# Patient Record
Sex: Female | Born: 1960 | Race: White | Hispanic: No | Marital: Married | Smoking: Current every day smoker
Health system: Southern US, Community
[De-identification: ages and names within clinical notes are randomized; demographics above are authoritative.]

---

## 2007-10-09 ENCOUNTER — Emergency Department: Payer: Self-pay | Admitting: Emergency Medicine

## 2008-12-17 ENCOUNTER — Emergency Department: Payer: Self-pay | Admitting: Internal Medicine

## 2009-06-30 ENCOUNTER — Emergency Department: Payer: Self-pay | Admitting: Emergency Medicine

## 2009-07-17 ENCOUNTER — Emergency Department: Payer: Self-pay | Admitting: Emergency Medicine

## 2010-02-26 ENCOUNTER — Emergency Department: Payer: Self-pay | Admitting: Emergency Medicine

## 2011-12-24 ENCOUNTER — Emergency Department: Payer: Self-pay | Admitting: Emergency Medicine

## 2011-12-24 LAB — CBC WITH DIFFERENTIAL/PLATELET
Basophil #: 0.1 10*3/uL (ref 0.0–0.1)
Basophil %: 1.2 %
Eosinophil %: 2.9 %
Lymphocyte %: 16.9 %
MCH: 29.1 pg (ref 26.0–34.0)
Monocyte %: 18.2 %
Neutrophil %: 60.8 %
Platelet: 155 10*3/uL (ref 150–440)
RBC: 4.39 10*6/uL (ref 3.80–5.20)
WBC: 5.6 10*3/uL (ref 3.6–11.0)

## 2011-12-24 LAB — COMPREHENSIVE METABOLIC PANEL
Albumin: 3.6 g/dL (ref 3.4–5.0)
Anion Gap: 11 (ref 7–16)
Calcium, Total: 9 mg/dL (ref 8.5–10.1)
Chloride: 106 mmol/L (ref 98–107)
Creatinine: 0.83 mg/dL (ref 0.60–1.30)
Glucose: 105 mg/dL — ABNORMAL HIGH (ref 65–99)
Potassium: 3.5 mmol/L (ref 3.5–5.1)
SGOT(AST): 19 U/L (ref 15–37)
SGPT (ALT): 17 U/L (ref 12–78)
Total Protein: 7.4 g/dL (ref 6.4–8.2)

## 2012-08-25 ENCOUNTER — Emergency Department: Payer: Self-pay | Admitting: Emergency Medicine

## 2012-08-25 LAB — CBC
HCT: 38.8 % (ref 35.0–47.0)
HGB: 13.3 g/dL (ref 12.0–16.0)
MCHC: 34.2 g/dL (ref 32.0–36.0)
RDW: 13.5 % (ref 11.5–14.5)

## 2012-08-25 LAB — TSH: Thyroid Stimulating Horm: 16.2 u[IU]/mL — ABNORMAL HIGH

## 2012-08-25 LAB — BASIC METABOLIC PANEL
Anion Gap: 6 — ABNORMAL LOW (ref 7–16)
BUN: 11 mg/dL (ref 7–18)
Calcium, Total: 9.2 mg/dL (ref 8.5–10.1)
Chloride: 109 mmol/L — ABNORMAL HIGH (ref 98–107)
EGFR (African American): >60
EGFR (Non-African Amer.): >60
Glucose: 98 mg/dL (ref 65–99)
Osmolality: 279 (ref 275–301)
Potassium: 3.6 mmol/L (ref 3.5–5.1)

## 2012-08-25 LAB — TROPONIN I: Troponin-I: <0.02 ng/mL

## 2012-12-27 ENCOUNTER — Emergency Department: Payer: Self-pay

## 2012-12-27 LAB — COMPREHENSIVE METABOLIC PANEL
Albumin: 3.9 g/dL (ref 3.4–5.0)
BUN: 12 mg/dL (ref 7–18)
Bilirubin,Total: 0.5 mg/dL (ref 0.2–1.0)
Calcium, Total: 9.3 mg/dL (ref 8.5–10.1)
Chloride: 109 mmol/L — ABNORMAL HIGH (ref 98–107)
Co2: 22 mmol/L (ref 21–32)
EGFR (African American): >60
Osmolality: 274 (ref 275–301)
Potassium: 3.5 mmol/L (ref 3.5–5.1)
SGOT(AST): 24 U/L (ref 15–37)
SGPT (ALT): 16 U/L (ref 12–78)
Total Protein: 7.5 g/dL (ref 6.4–8.2)

## 2012-12-27 LAB — CBC
HGB: 13.3 g/dL (ref 12.0–16.0)
MCH: 29.2 pg (ref 26.0–34.0)
MCV: 84 fL (ref 80–100)
Platelet: 193 10*3/uL (ref 150–440)
RBC: 4.54 10*6/uL (ref 3.80–5.20)
RDW: 13.9 % (ref 11.5–14.5)
WBC: 6.6 10*3/uL (ref 3.6–11.0)

## 2012-12-27 LAB — CK TOTAL AND CKMB (NOT AT ARMC): CK, Total: 59 U/L (ref 21–215)

## 2013-01-04 ENCOUNTER — Emergency Department: Payer: Self-pay | Admitting: Emergency Medicine

## 2013-01-04 LAB — COMPREHENSIVE METABOLIC PANEL
Albumin: 3.9 g/dL (ref 3.4–5.0)
Alkaline Phosphatase: 77 U/L (ref 50–136)
Anion Gap: 7 (ref 7–16)
BUN: 6 mg/dL — ABNORMAL LOW (ref 7–18)
Bilirubin,Total: 0.6 mg/dL (ref 0.2–1.0)
Calcium, Total: 9.2 mg/dL (ref 8.5–10.1)
Chloride: 108 mmol/L — ABNORMAL HIGH (ref 98–107)
EGFR (African American): >60
Glucose: 91 mg/dL (ref 65–99)
Osmolality: 271 (ref 275–301)
SGOT(AST): 35 U/L (ref 15–37)
SGPT (ALT): 17 U/L (ref 12–78)
Sodium: 137 mmol/L (ref 136–145)

## 2013-01-04 LAB — CBC
HCT: 37.8 % (ref 35.0–47.0)
MCH: 29.3 pg (ref 26.0–34.0)
Platelet: 208 10*3/uL (ref 150–440)
WBC: 5.7 10*3/uL (ref 3.6–11.0)

## 2013-01-17 ENCOUNTER — Observation Stay: Payer: Self-pay | Admitting: Specialist

## 2013-01-17 LAB — COMPREHENSIVE METABOLIC PANEL
Anion Gap: 6 — ABNORMAL LOW (ref 7–16)
BUN: 8 mg/dL (ref 7–18)
Chloride: 110 mmol/L — ABNORMAL HIGH (ref 98–107)
Co2: 25 mmol/L (ref 21–32)
EGFR (Non-African Amer.): >60
Osmolality: 279 (ref 275–301)
Potassium: 3.5 mmol/L (ref 3.5–5.1)
SGPT (ALT): 15 U/L (ref 12–78)

## 2013-01-17 LAB — CBC
HCT: 39 % (ref 35.0–47.0)
HGB: 13.5 g/dL (ref 12.0–16.0)
MCHC: 34.5 g/dL (ref 32.0–36.0)
MCV: 85 fL (ref 80–100)
Platelet: 181 10*3/uL (ref 150–440)
RDW: 13.8 % (ref 11.5–14.5)

## 2013-01-17 LAB — CK TOTAL AND CKMB (NOT AT ARMC)
CK, Total: 49 U/L (ref 21–215)
CK-MB: <0.5 ng/mL — ABNORMAL LOW (ref 0.5–3.6)
CK-MB: <0.5 ng/mL — ABNORMAL LOW (ref 0.5–3.6)

## 2013-01-17 LAB — TROPONIN I: Troponin-I: 0.06 ng/mL — ABNORMAL HIGH

## 2013-01-18 LAB — CK TOTAL AND CKMB (NOT AT ARMC): CK, Total: 36 U/L (ref 21–215)

## 2013-01-18 LAB — CBC WITH DIFFERENTIAL/PLATELET
Basophil #: 0.1 10*3/uL (ref 0.0–0.1)
Basophil %: 1.2 %
Eosinophil #: 0.2 10*3/uL (ref 0.0–0.7)
HGB: 12.3 g/dL (ref 12.0–16.0)
Lymphocyte %: 25.4 %
MCH: 29.3 pg (ref 26.0–34.0)
MCHC: 34.4 g/dL (ref 32.0–36.0)
MCV: 85 fL (ref 80–100)
Monocyte %: 12.9 %
Neutrophil #: 2.4 10*3/uL (ref 1.4–6.5)
Platelet: 156 10*3/uL (ref 150–440)
RBC: 4.2 10*6/uL (ref 3.80–5.20)
RDW: 13.9 % (ref 11.5–14.5)

## 2013-01-18 LAB — LIPID PANEL
Cholesterol: 163 mg/dL (ref 0–200)
Ldl Cholesterol, Calc: 91 mg/dL (ref 0–100)
Triglycerides: 173 mg/dL (ref 0–200)

## 2013-01-18 LAB — TROPONIN I: Troponin-I: 0.08 ng/mL — ABNORMAL HIGH

## 2013-02-15 ENCOUNTER — Emergency Department: Payer: Self-pay | Admitting: Emergency Medicine

## 2013-02-15 LAB — RAPID INFLUENZA A&B ANTIGENS

## 2013-03-08 ENCOUNTER — Emergency Department: Payer: Self-pay | Admitting: Emergency Medicine

## 2013-03-08 LAB — COMPREHENSIVE METABOLIC PANEL
Albumin: 3.9 g/dL (ref 3.4–5.0)
Anion Gap: 6 — ABNORMAL LOW (ref 7–16)
BUN: 9 mg/dL (ref 7–18)
Bilirubin,Total: 0.4 mg/dL (ref 0.2–1.0)
Calcium, Total: 9.5 mg/dL (ref 8.5–10.1)
Co2: 24 mmol/L (ref 21–32)
Creatinine: 0.77 mg/dL (ref 0.60–1.30)
EGFR (African American): >60
EGFR (Non-African Amer.): >60
Glucose: 90 mg/dL (ref 65–99)
Osmolality: 276 (ref 275–301)
SGPT (ALT): 14 U/L (ref 12–78)

## 2013-03-08 LAB — LIPASE, BLOOD: Lipase: 184 U/L (ref 73–393)

## 2013-03-08 LAB — CBC
HCT: 41.7 % (ref 35.0–47.0)
MCHC: 33.8 g/dL (ref 32.0–36.0)
MCV: 85 fL (ref 80–100)
Platelet: 210 10*3/uL (ref 150–440)
WBC: 5 10*3/uL (ref 3.6–11.0)

## 2013-05-17 ENCOUNTER — Emergency Department: Payer: Self-pay | Admitting: Emergency Medicine

## 2013-05-17 LAB — CBC
HCT: 39.3 % (ref 35.0–47.0)
HGB: 13.1 g/dL (ref 12.0–16.0)
MCH: 28.5 pg (ref 26.0–34.0)
MCHC: 33.3 g/dL (ref 32.0–36.0)
MCV: 86 fL (ref 80–100)
Platelet: 197 10*3/uL (ref 150–440)
RBC: 4.59 10*6/uL (ref 3.80–5.20)
RDW: 14.5 % (ref 11.5–14.5)
WBC: 10.9 10*3/uL (ref 3.6–11.0)

## 2013-05-17 LAB — BASIC METABOLIC PANEL
Anion Gap: 4 — ABNORMAL LOW (ref 7–16)
BUN: 14 mg/dL (ref 7–18)
CALCIUM: 8.9 mg/dL (ref 8.5–10.1)
CHLORIDE: 108 mmol/L — AB (ref 98–107)
Co2: 27 mmol/L (ref 21–32)
Creatinine: 0.72 mg/dL (ref 0.60–1.30)
EGFR (Non-African Amer.): >60
Glucose: 81 mg/dL (ref 65–99)
Osmolality: 277 (ref 275–301)
POTASSIUM: 3.8 mmol/L (ref 3.5–5.1)
SODIUM: 139 mmol/L (ref 136–145)

## 2013-05-17 LAB — TROPONIN I: Troponin-I: 0.04 ng/mL

## 2014-07-26 NOTE — Discharge Summary (Signed)
PATIENT NAME:  Sonya Wallace, Sonya Wallace MR#:  [id number] DATE OF BIRTH:  [birth year]  DATE OF ADMISSION:  01/17/2013 DATE OF DISCHARGE:  01/18/2013  For a detailed note, please take a look at the history and physical done on admission by Dr. Luberta MutterKonidena.    DIAGNOSES AT DISCHARGE: As follows: 1. Chest pain with elevated troponin, likely in the setting of anxiety. No evidence of acute myocardial ischemia.  2. Anxiety.  3. Gastroesophageal reflux disease.  4. Hypothyroidism.   DIET: The patient is being discharged on a regular diet.   ACTIVITY: As tolerated.   FOLLOWUP: With Dr. Mayer Maskerobert Mead in the next 1 to 2 weeks.   DISCHARGE MEDICATIONS: Protonix 40 mg daily, Synthroid 50 mcg daily, Xanax 0.25 mg b.i.d. as needed and Carafate 1 g q.6 hours.   CONSULTANT DURING THE HOSPITAL COURSE: Dr. Juliann Paresallwood from cardiology.   PERTINENT STUDIES DONE DURING THE HOSPITAL COURSE: As follows: Chest x-ray done on admission showing no evidence of pneumonia or congestive heart failure. A nuclear medicine myocardial scan done on October 16th showing no significant wall motion abnormalities. Estimated EF of 45%. No EKG changes concerning for ischemia.   HOSPITAL COURSE: This is a 54 year old female who presented to the hospital with chest pain and mildly elevated troponin.  1. Chest pain: The most likely cause of the patient's chest pain was likely related to anxiety. The patient did have a slightly elevated troponin and has ongoing tobacco abuse and, therefore, was observed overnight on telemetry. Her troponins trended up to as high as 0.08. She underwent a nuclear medicine myocardial stress test which showed no evidence of acute myocardial ischemia. She is currently chest pain-free and hemodynamically stable and, therefore, is being discharged home. She will continue close followup with her primary care physician, Dr. Bethena MidgetMead, as an outpatient. She was strongly advised to quit smoking.  2. Anxiety: The patient was maintained on  her Xanax. She will resume that.  3. Hypothyroidism: The patient was maintained on her Synthroid. She will also resume that.  4. GERD: The patient was maintained on her Protonix, and she will resume that along with her Carafate.   CODE STATUS: The patient is a FULL CODE.   TIME SPENT ON DISCHARGE: 35 minutes.   ____________________________ Rolly PancakeVivek J. Cherlynn KaiserSainani, MD vjs:gb D: 01/18/2013 17:10:14 ET T: 01/18/2013 22:17:08 ET JOB#: [id number]  cc: Rolly PancakeVivek J. Cherlynn KaiserSainani, MD, <Dictator> Mickel Crowobert J. Mead, MD [place] SirenVIVEK J Mireyah Chervenak MD ELECTRONICALLY SIGNED 01/31/2013 14:31

## 2014-07-26 NOTE — H&P (Signed)
PATIENT NAME:  Sonya Wallace, HILDENBRAND MR#:  [id number] DATE OF BIRTH:  1961/03/17  DATE OF ADMISSION:  01/17/2013  PRIMARY CARE PHYSICIAN:  Dr. Mayer Masker.  EMERGENCY ROOM PHYSICIAN: Dr. Chaney Malling.   CHIEF COMPLAINT: Chest pain.   HISTORY OF PRESENT ILLNESS: A 54 year old female patient with history of hypothyroidism and anxiety presents with chest pain in the middle of the chest radiating to her left arm with numbness. The patient has no radiation to the back. No cough. No fever. The patient has no shortness of breath. No nausea. No vomiting. No diaphoresis.  Chest pain  relieved with a GI cocktail that was given in the ER. The patient was here on 10/02 and also 09/24 for the same problem, The patient's troponins were negative at that time, so she was sent home with GI medication including Carafate. The patient says that Carafate helped a little but because she has to take it every 6 hours, she does not want to take it anymore, and troponin today is 0.06. So the ER doctor spoke to Dr. Juliann Pares who recommended to monitor for further troponin trend. The patient is going to be admitted for chest pain evaluation with mildly elevated troponins. The patient will have a Lexiscan stress test tomorrow.   PAST MEDICAL HISTORY: Significant for hypothyroidism and anxiety.   ALLERGIES: No known allergies.   SOCIAL HISTORY:smokes half pack per day..  She is trying to quit. The patient has no drinking. No alcohol. 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   FAMILY HISTORY: Significant for diabetes in mother. No history of premature coronary artery disease.  PAST SURGICAL HISTORY: None.   MEDICATIONS: Xanax 0.25 mg p.o. every 12 hours as needed, Carafate 1 gram p.o. every 6 hours, Levothyroxine 50 mcg p.o. daily, pantoprazole 40 mg p.o. daily.   REVIEW OF SYSTEMS:  CONSTITUTIONAL: Has no fever or fatigue. No weakness.  Chest pain ( as dictated in H `P EYES: No blurred vision.  ENT: No tinnitus. No  epistaxis. No difficulty swallowing.  RESPIRATORY: No cough. No wheezing.  CARDIOVASCULAR: Had chest pain this morning, but denies any chest pain now. No orthopnea. No PND.  GASTROINTESTINAL: No nausea. No vomiting. No abdominal pain.  GENITOURINARY: Has no dysuria.  ENDOCRINE: No polyuria, no nocturia. HEMATOLOGIC: No anemia.  MUSCULOSKELETAL: No joint pains.  NEUROLOGIC: No numbness or weakness. No dysarthria.  PSYCHIATRIC: No has a history of anxiety.   PHYSICAL EXAMINATION: VITAL SIGNS: Temperature 98.4, heart rate 71, blood pressure is 139/68 respiration 24, sats 100% on room air.  GENERAL: The patient is alert, awake, oriented, pleasant female, not in distress, answering questions appropriately.  HEAD: Atraumatic, normocephalic. Pupils equally reacting to light. Extraocular movements intact. No conjunctival congestion. No scleral icterus.  NOSE: No turbinate hypertrophy. No drainage.  EARS: No tympanic membrane congestion.  MOUTH: No lesions. No exudates.  NECK: Supple. No JVD. Thyroid is in the midline.  RESPIRATORY: Clear to auscultation. No wheeze noted.  CARDIOVASCULAR: S1, S2 regular. No murmurs. PMI is not displaced. The patient has no costochondral tenderness.  GASTROINTESTINAL:  Abdomen soft, nontender, nondistended. No organomegaly. The patient's bowel sounds are present in all quadrants.  MUSCULOSKELETAL: Strength 5/5 in upper and lower extremities. Able to move all extremities x 4. Strength and tone are equal bilaterally.  SKIN: Inspection is normal, well-hydrated no diaphoresis. No obvious wounds.  NEUROLOGIC: Cranial nerves II through XII intact. DTRs 2+ bilaterally.  Power 5/5 in upper and lower extremities.  PSYCHIATRIC: Mood and  affect are within normal limits.    LAB DATA: Electrolytes: Sodium 141, potassium 3.5, chloride 110, bicarbonate 25, BUN 8, creatinine 0.70, glucose 87, CPK-MB less than 0.5. CK total 49.  Chest x-ray shows no evidence of CHF and the patient  has some COPD changes.   WBC 4.3, hemoglobin 13.3, hematocrit 39, platelets 181. Troponin 0.06.   EKG: Normal sinus rhythm at 76 beats per minute.   ASSESSMENT AND PLAN: 381.  A 54 year old female patient with chest pain, sounds atypical, likely secondary to gastroesophageal reflux disease but because troponin is slightly up we are going to be admitted her for chest pain evaluation and observation status. Continue to cycle troponins every 8 hours for 2 more times. Get a Lexiscan stress test tomorrow. Continue aspirin, nitroglycerin, beta blockers.  Obtain fasting lipase.  2.  Gastroesophageal reflux disease. The patient had symptom relief with gastrointestinal cocktail. We will  continue proton pump inhibitors 3.  Tobacco abuse. Counseled against smoking for 3 minutes, offered a nicotine patch.  4.  Chronic obstructive pulmonary disease changes on x-ray, but clinically no wheezing. Monitor closely.  5.  Hypothyroidism: Continue home dose of Synthroid.  6.  Anxiety. Continue Xanax as needed.   The patient's condition is stable.   TIME SPENT: About 55 minutes.    ____________________________ Katha HammingSnehalatha Thelmer Legler, MD sk:dp D: 01/17/2013 15:38:45 ET T: 01/17/2013 15:58:46 ET JOB#: [id number]  cc: Katha HammingSnehalatha Clotilda Hafer, MD, <Dictator> Mickel Crowobert J. Mead, MD Katha HammingSNEHALATHA Rosena Bartle MD ELECTRONICALLY SIGNED 02/20/2013 14:02

## 2014-10-16 IMAGING — CR DG CHEST 2V
1 series · 2 of 2 positions shown · non-contrast
Comparison: 01/17/2013

CLINICAL DATA: Cough and short of breath

EXAM:
CHEST  2 VIEW

[Series 1: w chest pa · 0.14mm/px · 2 of 2 slices shown]
[im 1/2]
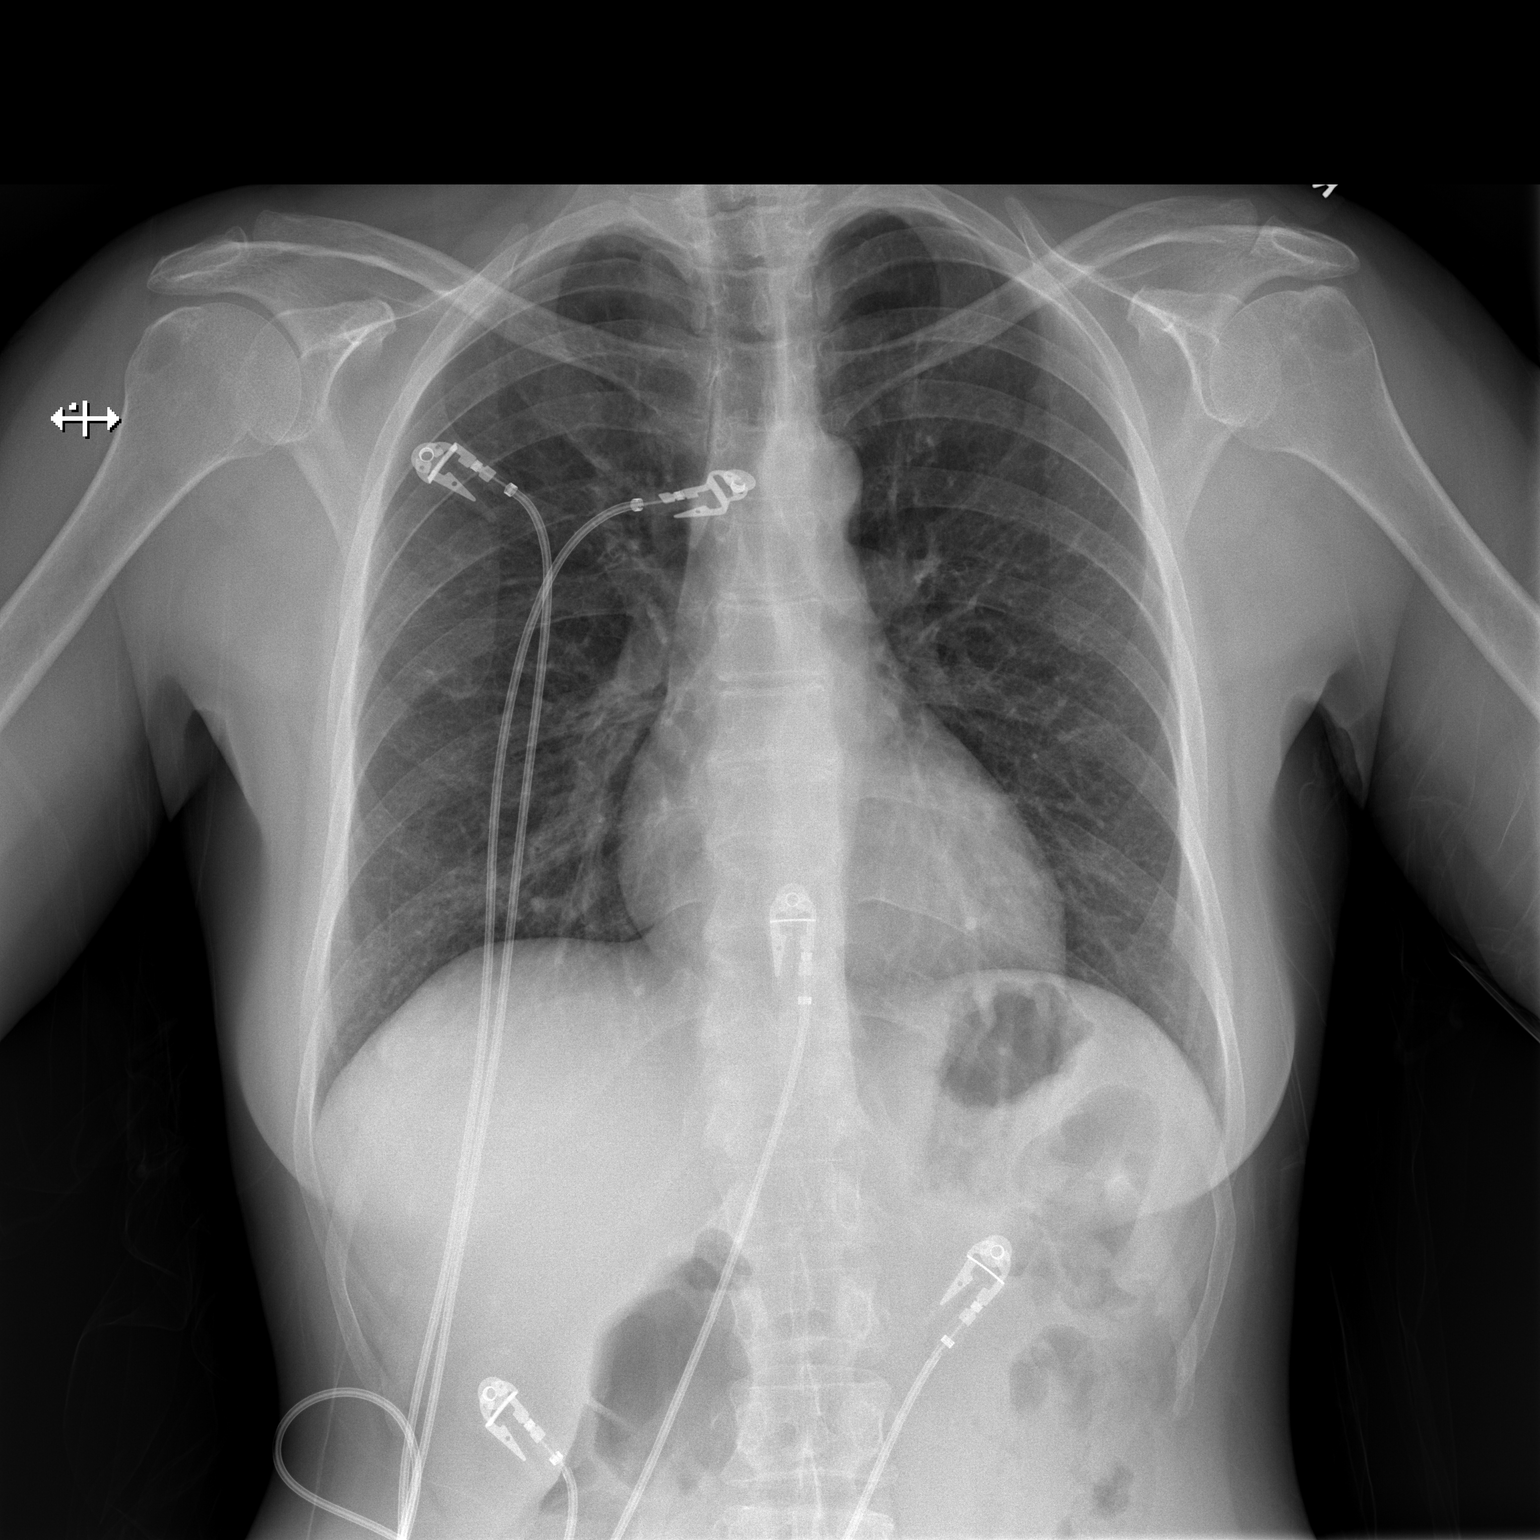
[im 2/2]
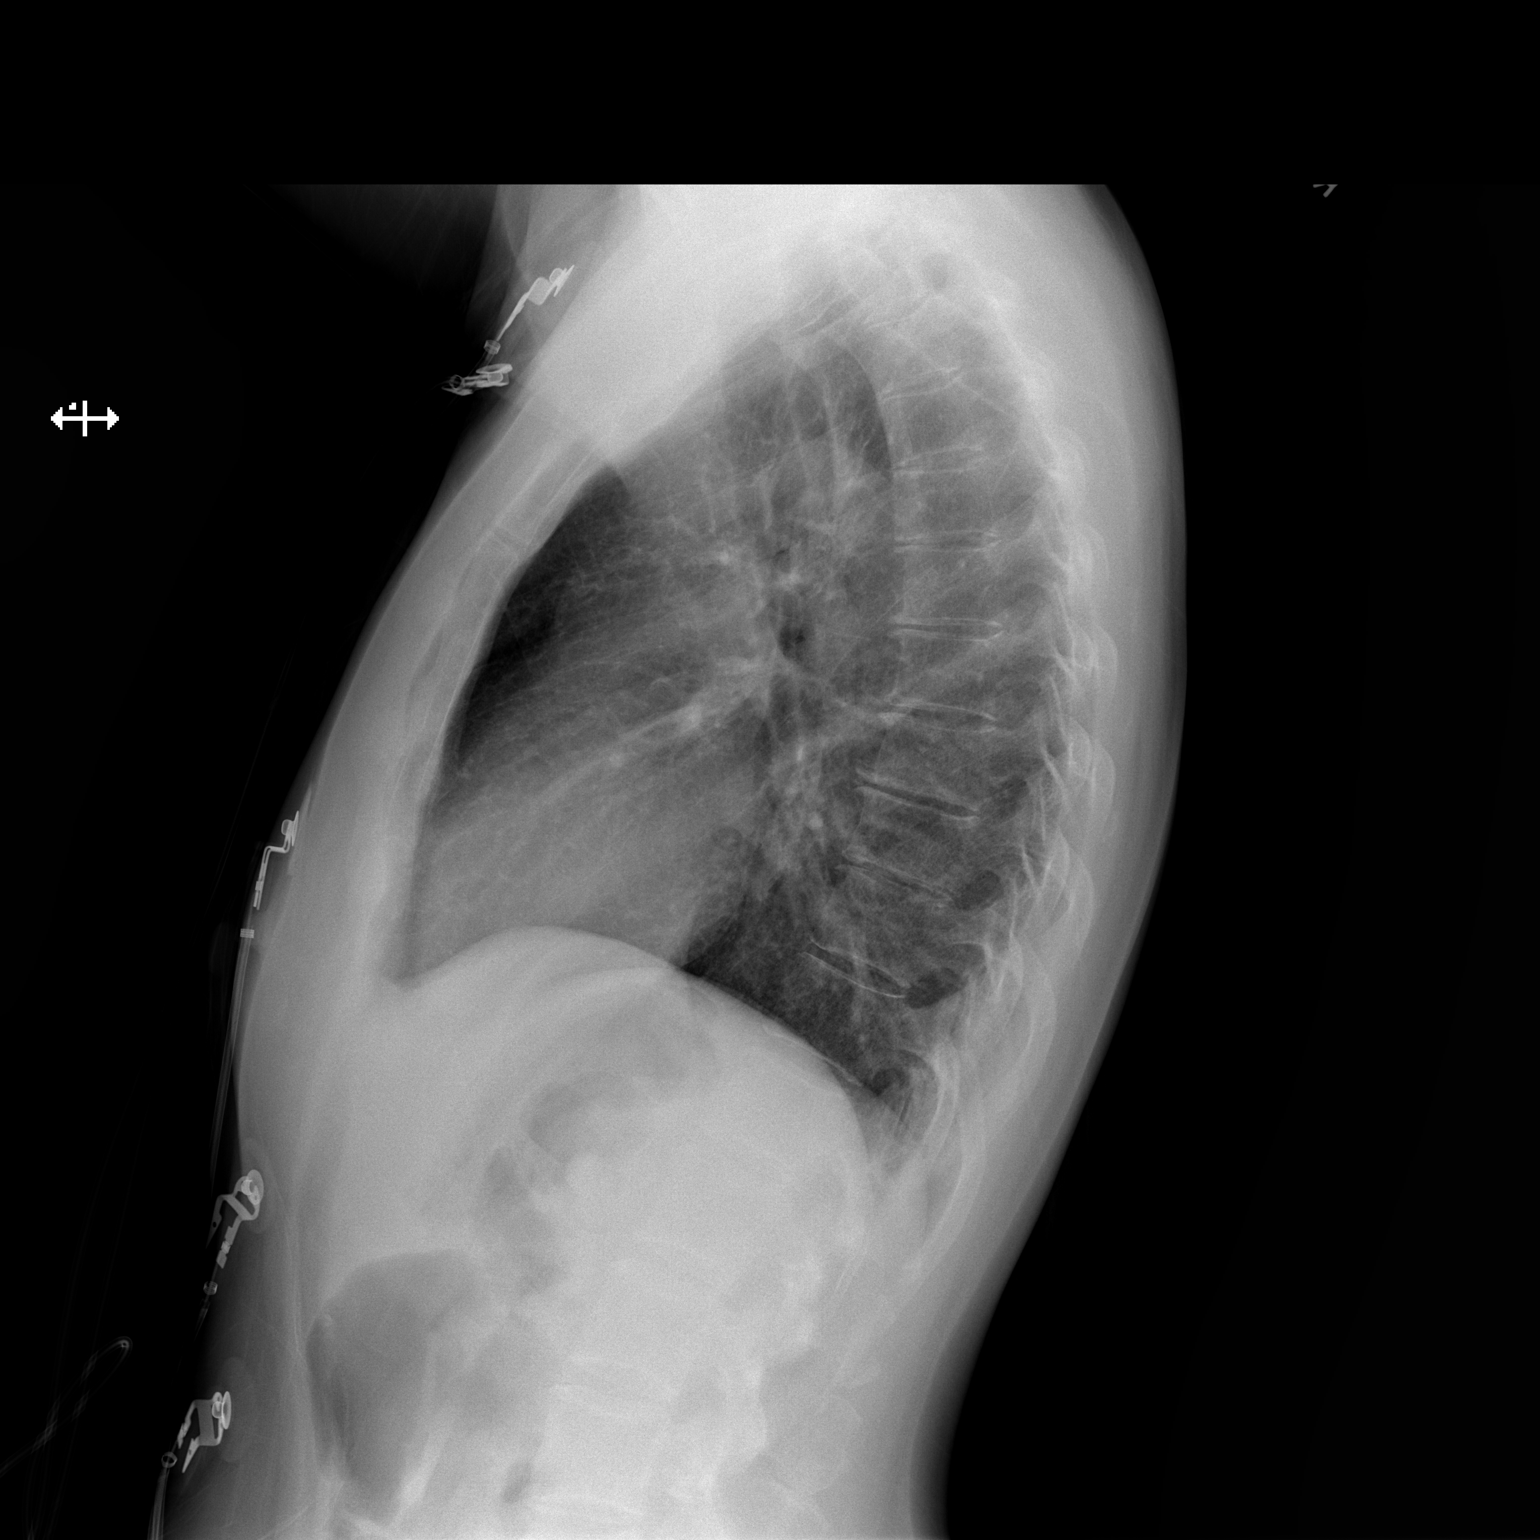

[2 of 2 positions shown; findings below may reference images not displayed]

FINDINGS: The heart size and mediastinal contours are within normal limits.
Both lungs are clear. The visualized skeletal structures are
unremarkable.
IMPRESSION: No active cardiopulmonary disease.

## 2016-11-24 ENCOUNTER — Emergency Department
Admission: EM | Admit: 2016-11-24 | Discharge: 2016-11-24 | Disposition: A | Discharge status: Home / Self Care | Payer: Self-pay | Attending: Emergency Medicine | Admitting: Emergency Medicine

## 2016-11-24 ENCOUNTER — Encounter: Payer: Self-pay | Admitting: Emergency Medicine

## 2016-11-24 DIAGNOSIS — R05 Cough: Secondary | ICD-10-CM | POA: Insufficient documentation

## 2016-11-24 DIAGNOSIS — J9801 Acute bronchospasm: Secondary | ICD-10-CM | POA: Insufficient documentation

## 2016-11-24 MED ORDER — METHYLPREDNISOLONE 4 MG PO TBPK
ORAL_TABLET | ORAL | 0 refills | Status: DC
Start: 2016-11-24 — End: 2020-07-17

## 2016-11-24 MED ORDER — HYDROCOD POLST-CPM POLST ER 10-8 MG/5ML PO SUER: 5.0000 mL | Freq: Two times a day (BID) | ORAL | 0 refills | Status: DC

## 2016-11-24 NOTE — ED Notes (Signed)
See triage note  Presents with non prod cough for about 1 week  afebrile on arrival

## 2016-11-24 NOTE — ED Triage Notes (Signed)
Pt reports non productive cough for over one week. Pt reports coughs so much it takes her breath. Denies pain. Denies NVD. Denies fever. Pt speaking in complete sentences without difficulty.

## 2016-11-24 NOTE — ED Provider Notes (Signed)
[facility] Emergency Department Provider Note   ____________________________________________   First MD Initiated Contact with Patient 11/24/16 1145     (approximate)  I have reviewed the triage vital signs and the nursing notes.   HISTORY  Chief Complaint Cough    HPI Sonya Wallace is a 56 y.o. female complaining a nonproductive cough for 1 week. Patient states coughing, shortness of breath. Patient denies nasal congestion, rhinorrhea, or fever. Patient admits to tobacco use.No palliative measures for complaint.   History reviewed. No pertinent past medical history.  There are no active problems to display for this patient.   History reviewed. No pertinent surgical history.  Prior to Admission medications   Medication Sig Start Date End Date Taking? Authorizing Provider  chlorpheniramine-HYDROcodone (TUSSIONEX PENNKINETIC ER) 10-8 MG/5ML SUER Take 5 mLs by mouth 2 (two) times daily. 11/24/16   Joni Reining, PA-C  methylPREDNISolone (MEDROL DOSEPAK) 4 MG TBPK tablet Take Tapered dose as directed 11/24/16   Joni Reining, PA-C    Allergies Patient has no known allergies.  No family history on file.  Social History Social History  Substance Use Topics  . Smoking status: Not on file  . Smokeless tobacco: Not on file  . Alcohol use Not on file    Review of Systems Constitutional: No fever/chills Eyes: No visual changes. ENT: No sore throat. Cardiovascular: Denies chest pain. Respiratory: Shortness of breath secondary to coughing spells.  Gastrointestinal: No abdominal pain.  No nausea, no vomiting.  No diarrhea.  No constipation. Genitourinary: Negative for dysuria. Musculoskeletal: Negative for back pain. Skin: Negative for rash. Neurological: Negative for headaches, focal weakness or numbness.   ____________________________________________   PHYSICAL EXAM:  VITAL SIGNS: ED Triage Vitals  Enc Vitals Group     BP 11/24/16  1134 136/84     Pulse Rate 11/24/16 1134 80     Resp 11/24/16 1134 18     Temp 11/24/16 1134 98.3 F (36.8 C)     Temp Source 11/24/16 1134 Oral     SpO2 11/24/16 1134 97 %     Weight 11/24/16 1132 133 lb (60.3 kg)     Height 11/24/16 1132 5\' 2"  (1.575 m)     Head Circumference --      Peak Flow --      Pain Score --      Pain Loc --      Pain Edu? --      Excl. in GC? --     Constitutional: Alert and oriented. Well appearing and in no acute distress. Nose: No congestion/rhinnorhea. Mouth/Throat: Mucous membranes are moist.  Oropharynx non-erythematous. Neck: No stridor.  No cervical spine tenderness to palpation. Cardiovascular: Normal rate, regular rhythm. Grossly normal heart sounds.  Good peripheral circulation. Respiratory: Normal respiratory effort.  No retractions. Mild wheezing noted with inspiration. Neurologic:  Normal speech and language. No gross focal neurologic deficits are appreciated. No gait instability. Skin:  Skin is warm, dry and intact. No rash noted. Psychiatric: Mood and affect are normal. Speech and behavior are normal.  ____________________________________________   LABS (all labs ordered are listed, but only abnormal results are displayed)  Labs Reviewed - No data to display ____________________________________________  EKG   ____________________________________________  RADIOLOGY  No results found.  ____________________________________________   PROCEDURES  Procedure(s) performed:   Procedures  Critical Care performed: No  ____________________________________________   INITIAL IMPRESSION / ASSESSMENT AND PLAN / ED COURSE  Pertinent labs & imaging results that were available  during my care of the patient were reviewed by me and considered in my medical decision making (see chart for details).  Cough secondary to bronchospasm. Patient discharged care instructions. Patient work no. Patient has taken medication as directed and  follow-up with the [facility].      ____________________________________________   FINAL CLINICAL IMPRESSION(S) / ED DIAGNOSES  Final diagnoses:  Cough due to bronchospasm      NEW MEDICATIONS STARTED DURING THIS VISIT:  New Prescriptions   CHLORPHENIRAMINE-HYDROCODONE (TUSSIONEX PENNKINETIC ER) 10-8 MG/5ML SUER    Take 5 mLs by mouth 2 (two) times daily.   METHYLPREDNISOLONE (MEDROL DOSEPAK) 4 MG TBPK TABLET    Take Tapered dose as directed     Note:  This document was prepared using Dragon voice recognition software and may include unintentional dictation errors. Joni Reining, PA-C 11/24/16 1155    Emily Filbert, MD 11/24/16 [id number]

## 2017-04-07 ENCOUNTER — Encounter: Payer: Self-pay | Admitting: Podiatry

## 2017-04-07 ENCOUNTER — Ambulatory Visit (INDEPENDENT_AMBULATORY_CARE_PROVIDER_SITE_OTHER): Payer: Self-pay | Admitting: Podiatry

## 2017-04-07 DIAGNOSIS — M2041 Other hammer toe(s) (acquired), right foot: Secondary | ICD-10-CM

## 2017-04-07 DIAGNOSIS — L84 Corns and callosities: Secondary | ICD-10-CM

## 2017-04-07 NOTE — Progress Notes (Signed)
   Subjective:    Patient ID: Sonya Wallace, female    DOB: 09/15/1960, 57 y.o.   MRN: [id number]  HPIthis patient presents the office stating that she has an ingrown toenail on the fifth toenail of the right foot.  She says this is been present for approximately 6 months.  It causes pain and discomfort and frequently is painful pink and swollen.  She has soaked this area but the problem seems to be worsening.  She has been seen by a professional for this problem.  She denies any trauma or injury to the toe.  She presents the office today for an evaluation and treatment of this painful fifth toe.    Review of Systems  All other systems reviewed and are negative.      Objective:   Physical Exam General Appearance  Alert, conversant and in no acute stress.  Vascular  Dorsalis pedis and posterior pulses are palpable  bilaterally.  Capillary return is within normal limits  bilaterally. Temperature is within normal limits  Bilaterally.  Neurologic  Senn-Weinstein monofilament wire test within normal limits  bilaterally. Muscle power within normal limits bilaterally.  Nails Thick disfigured discolored nails with subungual debris fifth toe right foot.    Orthopedic  No limitations of motion of motion feet bilaterally.  No crepitus or effusions noted.  Adductovarus fifth digit right foot with the bone spur noted at the lateral aspect fifth toenail.  Skin  normotropic skin with no porokeratosis noted bilaterally.  No signs of infections or ulcers noted.  Blisters corn noted fifth toe right foot.        Assessment & Plan:  Listers corn secondary hammer toe fifth right foot.   IE  Debridement of lidters corn.  Padding fifth toe corn right foot.  ROV  Prn.   Helane GuntherGregory Lavaughn Bisig DPM

## 2017-06-29 ENCOUNTER — Encounter: Payer: Self-pay | Admitting: *Deleted

## 2017-06-29 ENCOUNTER — Other Ambulatory Visit: Payer: Self-pay

## 2017-06-29 ENCOUNTER — Emergency Department
Admission: EM | Admit: 2017-06-29 | Discharge: 2017-06-29 | Disposition: A | Discharge status: Home / Self Care | Payer: PRIVATE HEALTH INSURANCE | Attending: Emergency Medicine | Admitting: Emergency Medicine

## 2017-06-29 DIAGNOSIS — N3000 Acute cystitis without hematuria: Secondary | ICD-10-CM | POA: Insufficient documentation

## 2017-06-29 DIAGNOSIS — R110 Nausea: Secondary | ICD-10-CM

## 2017-06-29 DIAGNOSIS — R1013 Epigastric pain: Secondary | ICD-10-CM

## 2017-06-29 DIAGNOSIS — F17200 Nicotine dependence, unspecified, uncomplicated: Secondary | ICD-10-CM | POA: Insufficient documentation

## 2017-06-29 LAB — COMPREHENSIVE METABOLIC PANEL
ALK PHOS: 80 U/L (ref 38–126)
ALT: 15 U/L (ref 14–54)
AST: 22 U/L (ref 15–41)
Albumin: 4.5 g/dL (ref 3.5–5.0)
Anion gap: 10 (ref 5–15)
BILIRUBIN TOTAL: 0.5 mg/dL (ref 0.3–1.2)
BUN: 10 mg/dL (ref 6–20)
CALCIUM: 9.5 mg/dL (ref 8.9–10.3)
CHLORIDE: 102 mmol/L (ref 101–111)
CO2: 26 mmol/L (ref 22–32)
CREATININE: 0.63 mg/dL (ref 0.44–1.00)
Glucose, Bld: 94 mg/dL (ref 65–99)
Potassium: 4.5 mmol/L (ref 3.5–5.1)
Sodium: 138 mmol/L (ref 135–145)
TOTAL PROTEIN: 8 g/dL (ref 6.5–8.1)

## 2017-06-29 LAB — URINALYSIS, COMPLETE (UACMP) WITH MICROSCOPIC
Bilirubin Urine: NEGATIVE
GLUCOSE, UA: NEGATIVE mg/dL
KETONES UR: NEGATIVE mg/dL
Nitrite: NEGATIVE
PH: 5 (ref 5.0–8.0)
Protein, ur: NEGATIVE mg/dL
Specific Gravity, Urine: 1.005 (ref 1.005–1.030)

## 2017-06-29 LAB — CBC
HCT: 41.8 % (ref 35.0–47.0)
Hemoglobin: 14 g/dL (ref 12.0–16.0)
MCH: 28.8 pg (ref 26.0–34.0)
MCHC: 33.4 g/dL (ref 32.0–36.0)
MCV: 86.2 fL (ref 80.0–100.0)
PLATELETS: 222 10*3/uL (ref 150–440)
RBC: 4.85 MIL/uL (ref 3.80–5.20)
RDW: 13.9 % (ref 11.5–14.5)
WBC: 7.9 10*3/uL (ref 3.6–11.0)

## 2017-06-29 LAB — LIPASE, BLOOD: LIPASE: 38 U/L (ref 11–51)

## 2017-06-29 MED ORDER — ONDANSETRON 4 MG PO TBDP
4.0000 mg | ORAL_TABLET | Freq: Once | ORAL | Status: AC
  Administered 2017-06-29: 4 mg via ORAL

## 2017-06-29 MED ORDER — SULFAMETHOXAZOLE-TRIMETHOPRIM 800-160 MG PO TABS: 1.0000 | ORAL_TABLET | Freq: Two times a day (BID) | ORAL | 0 refills | Status: DC

## 2017-06-29 MED ORDER — SULFAMETHOXAZOLE-TRIMETHOPRIM 800-160 MG PO TABS
1.0000 | ORAL_TABLET | Freq: Once | ORAL | Status: AC
  Administered 2017-06-29: 1 via ORAL
  Filled 2017-06-29: qty 1

## 2017-06-29 MED ORDER — ONDANSETRON 4 MG PO TBDP: 4.0000 mg | ORAL_TABLET | Freq: Three times a day (TID) | ORAL | 0 refills | Status: DC | PRN

## 2017-06-29 NOTE — ED Provider Notes (Signed)
[facility] Emergency Department Provider Note  ____________________________________________  Time seen: Approximately 5:55 PM  I have reviewed the triage vital signs and the nursing notes.   HISTORY  Chief Complaint Abdominal Pain    HPI Sonya Wallace is a 57 y.o. female otherwise healthy, presenting with epigastric discomfort, nausea.  The patient reports that this morning, she was "sitting and relaxing" when she developed a crampy sensation in the epigastrium associate with nausea but no vomiting.  She had a normal bowel movement today.  She has not had any fevers or chills, dysuria, urinary frequency.  She has never experienced this discomfort in the past.  She did not try anything for her pain.  No past medical history on file.  There are no active problems to display for this patient.   No past surgical history on file.  Current Outpatient Rx  . Order #: [id number] Class: Print  . Order #: [id number] Class: Print  . Order #: [id number] Class: Print  . Order #: [id number] Class: Print    Allergies Patient has no known allergies.  No family history on file.  Social History Social History   Tobacco Use  . Smoking status: Current Every Day Smoker  . Smokeless tobacco: Never Used  Substance Use Topics  . Alcohol use: No  . Drug use: No    Review of Systems Constitutional: No fever/chills.  No lightheadedness or syncope. Eyes: No visual changes. ENT: No sore throat. No congestion or rhinorrhea. Cardiovascular: Denies chest pain. Denies palpitations. Respiratory: Denies shortness of breath.  No cough. Gastrointestinal: Positive epigastric abdominal pain.  Positive nausea, no vomiting.  No diarrhea.  No constipation.  Normal bowel movements. Genitourinary: Negative for dysuria.  No hematuria.  No urinary frequency.  No change in vaginal discharge. Musculoskeletal: Negative for back pain. Skin: Negative for rash. Neurological: Negative for  headaches. No focal numbness, tingling or weakness.     ____________________________________________   PHYSICAL EXAM:  VITAL SIGNS: ED Triage Vitals  Enc Vitals Group     BP 06/29/17 1647 (!) 158/95     Pulse Rate 06/29/17 1647 73     Resp 06/29/17 1647 20     Temp 06/29/17 1647 98.5 F (36.9 C)     Temp Source 06/29/17 1647 Oral     SpO2 06/29/17 1647 99 %     Weight 06/29/17 1643 140 lb (63.5 kg)     Height 06/29/17 1643 5\' 2"  (1.575 m)     Head Circumference --      Peak Flow --      Pain Score 06/29/17 1643 8     Pain Loc --      Pain Edu? --      Excl. in GC? --     Constitutional: Alert and oriented. Well appearing and in no acute distress. Answers questions appropriately.  Moves about the stretcher without any difficulty.  Is asking for food. Eyes: Conjunctivae are normal.  EOMI. No scleral icterus. Head: Atraumatic. Nose: No congestion/rhinnorhea. Mouth/Throat: Mucous membranes are moist.  Neck: No stridor.  Supple.  No JVD.  No meningismus. Cardiovascular: Normal rate, regular rhythm. No murmurs, rubs or gallops.  Respiratory: Normal respiratory effort.  No accessory muscle use or retractions. Lungs CTAB.  No wheezes, rales or ronchi. Gastrointestinal: Soft, nontender and nondistended.  No reproducible tenderness on my examination.  No guarding or rebound.  No peritoneal signs. Musculoskeletal: No LE edema.  Neurologic:  A&Ox3.  Speech is clear.  Face and smile are symmetric.  EOMI.  Moves all extremities well. Skin:  Skin is warm, dry and intact. No rash noted. Psychiatric: Mood and affect are normal. Speech and behavior are normal.  Normal judgement.  ____________________________________________   LABS (all labs ordered are listed, but only abnormal results are displayed)  Labs Reviewed  URINALYSIS, COMPLETE (UACMP) WITH MICROSCOPIC - Abnormal; Notable for the following components:      Result Value   Color, Urine YELLOW (*)    APPearance HAZY (*)     Hgb urine dipstick SMALL (*)    Leukocytes, UA MODERATE (*)    Bacteria, UA RARE (*)    Squamous Epithelial / LPF 0-5 (*)    All other components within normal limits  URINE CULTURE  LIPASE, BLOOD  COMPREHENSIVE METABOLIC PANEL  CBC   ____________________________________________  EKG  ED ECG REPORT I, Rockne MenghiniNorman, Anne-Caroline, the attending physician, personally viewed and interpreted this ECG.   Date: 06/29/2017  EKG Time: 1739  Rate: 68  Rhythm: normal sinus rhythm  Axis: normal  Intervals:none  ST&T Change: No STEMI  ____________________________________________  RADIOLOGY  No results found.  ____________________________________________   PROCEDURES  Procedure(s) performed: None  Procedures  Critical Care performed: No ____________________________________________   INITIAL IMPRESSION / ASSESSMENT AND PLAN / ED COURSE  Pertinent labs & imaging results that were available during my care of the patient were reviewed by me and considered in my medical decision making (see chart for details).  57 y.o. female, otherwise healthy, presenting with resolved epigastric discomfort and nausea.  The patient is hemodynamically stable and afebrile.  Her abdominal examination is completely benign at this time.  Her laboratory studies are reassuring with normal lipase, normal electrolytes, normal liver function tests and a normal white blood cell count as well as hemoglobin and hematocrit.  Her urinalysis does show rare bacteria with small number of white blood cells and some leukocyte esterase, so we will initiate treatment for UTI here, and discharge her home with a 3-day course of Bactrim.  She will be discharged with an antiemetic as well.  I have talked to the patient about expectant management, follow-up instructions and given her return precautions.  At this time, the patient is tolerating food and liquids without any difficulty, she is not having any pain or symptoms, and is  safe for discharge.  ____________________________________________  FINAL CLINICAL IMPRESSION(S) / ED DIAGNOSES  Final diagnoses:  Epigastric pain  Nausea without vomiting  Acute cystitis without hematuria         NEW MEDICATIONS STARTED DURING THIS VISIT:  New Prescriptions   ONDANSETRON (ZOFRAN ODT) 4 MG DISINTEGRATING TABLET    Take 1 tablet (4 mg total) by mouth every 8 (eight) hours as needed for nausea or vomiting.   SULFAMETHOXAZOLE-TRIMETHOPRIM (BACTRIM DS,SEPTRA DS) 800-160 MG TABLET    Take 1 tablet by mouth 2 (two) times daily.      Rockne MenghiniNorman, Anne-Caroline, MD 06/29/17 [id number]

## 2017-06-29 NOTE — Discharge Instructions (Signed)
These take a bland diet to prevent nausea and vomiting.  You may also use Zofran for nausea or vomiting.  Take the entire course of antibiotics, even if you are feeling better.  Please make sure to drink plenty of fluids to stay well-hydrated and to help clear your urinary tract infection.  Return to the emergency department if you develop severe pain, inability to keep down fluids, lightheadedness or fainting, or any other symptoms concerning to you.

## 2017-06-29 NOTE — ED Triage Notes (Signed)
Pt reports pain around the navel since this am.  Pt also reports nausea   No v/d.  No back pain.  No urinary sx.  Pt alert. .Marland Kitchen

## 2017-06-29 NOTE — ED Notes (Signed)
Pt alert and oriented X4, active, cooperative, pt in NAD. RR even and unlabored, color WNL.  Pt informed to return if any life threatening symptoms occur.  Discharge and followup instructions reviewed.  

## 2017-07-01 LAB — URINE CULTURE

## 2018-03-23 ENCOUNTER — Other Ambulatory Visit: Payer: Self-pay

## 2018-03-23 ENCOUNTER — Emergency Department
Admission: EM | Admit: 2018-03-23 | Discharge: 2018-03-23 | Disposition: A | Discharge status: Home / Self Care | Payer: PRIVATE HEALTH INSURANCE | Attending: Emergency Medicine | Admitting: Emergency Medicine

## 2018-03-23 ENCOUNTER — Emergency Department: Payer: PRIVATE HEALTH INSURANCE

## 2018-03-23 ENCOUNTER — Encounter: Payer: Self-pay | Admitting: Emergency Medicine

## 2018-03-23 DIAGNOSIS — F17200 Nicotine dependence, unspecified, uncomplicated: Secondary | ICD-10-CM | POA: Insufficient documentation

## 2018-03-23 DIAGNOSIS — Z79899 Other long term (current) drug therapy: Secondary | ICD-10-CM | POA: Insufficient documentation

## 2018-03-23 DIAGNOSIS — R05 Cough: Secondary | ICD-10-CM | POA: Diagnosis present

## 2018-03-23 DIAGNOSIS — R030 Elevated blood-pressure reading, without diagnosis of hypertension: Secondary | ICD-10-CM | POA: Diagnosis not present

## 2018-03-23 DIAGNOSIS — J189 Pneumonia, unspecified organism: Secondary | ICD-10-CM

## 2018-03-23 MED ORDER — BLOOD PRESSURE CUFF MISC: 1.0000 [IU] | Freq: Three times a day (TID) | 0 refills | Status: DC

## 2018-03-23 MED ORDER — BENZONATATE 100 MG PO CAPS: 100.0000 mg | ORAL_CAPSULE | Freq: Three times a day (TID) | ORAL | 0 refills | Status: AC | PRN

## 2018-03-23 MED ORDER — ALBUTEROL SULFATE HFA 108 (90 BASE) MCG/ACT IN AERS: 2.0000 | INHALATION_SPRAY | Freq: Four times a day (QID) | RESPIRATORY_TRACT | 0 refills | Status: DC | PRN

## 2018-03-23 MED ORDER — CEFTRIAXONE SODIUM 1 G IJ SOLR
1.0000 g | Freq: Once | INTRAMUSCULAR | Status: AC
  Administered 2018-03-23: 1 g via INTRAMUSCULAR
  Filled 2018-03-23: qty 10

## 2018-03-23 MED ORDER — IPRATROPIUM-ALBUTEROL 0.5-2.5 (3) MG/3ML IN SOLN
3.0000 mL | Freq: Once | RESPIRATORY_TRACT | Status: AC
  Administered 2018-03-23: 3 mL via RESPIRATORY_TRACT
  Filled 2018-03-23: qty 3

## 2018-03-23 MED ORDER — PREDNISONE 10 MG PO TABS: ORAL_TABLET | ORAL | 0 refills | Status: DC

## 2018-03-23 MED ORDER — METHYLPREDNISOLONE SODIUM SUCC 125 MG IJ SOLR
125.0000 mg | Freq: Once | INTRAMUSCULAR | Status: AC
  Administered 2018-03-23: 125 mg via INTRAMUSCULAR
  Filled 2018-03-23: qty 2

## 2018-03-23 MED ORDER — AZITHROMYCIN 250 MG PO TABS: ORAL_TABLET | ORAL | 0 refills | Status: DC

## 2018-03-23 NOTE — ED Notes (Signed)
See triage note  Presents with cough for couple of days  No fever   States cough is worse at night and prod  Yellowish phlegm

## 2018-03-23 NOTE — ED Triage Notes (Signed)
Pt with productive cough x 1 week.

## 2018-03-23 NOTE — ED Provider Notes (Signed)
[facility] Emergency Department Provider Note  ____________________________________________  Time seen: Approximately 2:04 PM  I have reviewed the triage vital signs and the nursing notes.   HISTORY  Chief Complaint Cough    HPI Sonya Wallace is a 57 y.o. female that presents emergency department for evaluation of productive cough with yellow sputum for 1 week.  She states that this happened once before, several years ago and needed antibiotics.  Patient states that she is sure that she has an infection in her lungs.  No sick contacts.  She smokes a pack of cigarettes per day.  No asthma or allergies.  She has never had respiratory failure.  No fever, chills, nasal congestion, sore throat, shortness of breath, chest pain, nausea, vomiting, abdominal pain.  History reviewed. No pertinent past medical history.  There are no active problems to display for this patient.   History reviewed. No pertinent surgical history.  Prior to Admission medications   Medication Sig Start Date End Date Taking? Authorizing Provider  albuterol (PROVENTIL HFA;VENTOLIN HFA) 108 (90 Base) MCG/ACT inhaler Inhale 2 puffs into the lungs every 6 (six) hours as needed for wheezing or shortness of breath. 03/23/18   Enid DerryWagner, Zsazsa Bahena, PA-C  azithromycin (ZITHROMAX Z-PAK) 250 MG tablet Take 2 tablets (500 mg) on  Day 1,  followed by 1 tablet (250 mg) once daily on Days 2 through 5. 03/23/18   Enid DerryWagner, Suellyn Meenan, PA-C  benzonatate (TESSALON PERLES) 100 MG capsule Take 1 capsule (100 mg total) by mouth 3 (three) times daily as needed for cough. 03/23/18 03/23/19  Enid DerryWagner, Wm Fruchter, PA-C  Blood Pressure Monitoring (BLOOD PRESSURE CUFF) MISC 1 Units by Does not apply route 3 (three) times daily. 03/23/18   Enid DerryWagner, Wiatt Mahabir, PA-C  chlorpheniramine-HYDROcodone (TUSSIONEX PENNKINETIC ER) 10-8 MG/5ML SUER Take 5 mLs by mouth 2 (two) times daily. Patient not taking: Reported on 04/07/2017 11/24/16   Joni ReiningSmith, Ronald  K, PA-C  methylPREDNISolone (MEDROL DOSEPAK) 4 MG TBPK tablet Take Tapered dose as directed Patient not taking: Reported on 04/07/2017 11/24/16   Joni ReiningSmith, Ronald K, PA-C  ondansetron (ZOFRAN ODT) 4 MG disintegrating tablet Take 1 tablet (4 mg total) by mouth every 8 (eight) hours as needed for nausea or vomiting. 06/29/17   Rockne MenghiniNorman, Anne-Caroline, MD  predniSONE (DELTASONE) 10 MG tablet Take 6 tablets on day 1, take 5 tablets on day 2, take 4 tablets on day 3, take 3 tablets on day 4, take 2 tablets on day 5, take 1 tablet on day 6 03/23/18   Enid DerryWagner, Margart Zemanek, PA-C  sulfamethoxazole-trimethoprim (BACTRIM DS,SEPTRA DS) 800-160 MG tablet Take 1 tablet by mouth 2 (two) times daily. 06/29/17   Rockne MenghiniNorman, Anne-Caroline, MD    Allergies Patient has no known allergies.  No family history on file.  Social History Social History   Tobacco Use  . Smoking status: Current Every Day Smoker  . Smokeless tobacco: Never Used  Substance Use Topics  . Alcohol use: No  . Drug use: No     Review of Systems  Constitutional: No fever/chills Eyes: No visual changes. No discharge. ENT: Negative for congestion and rhinorrhea. Cardiovascular: No chest pain. Respiratory: Positive for cough. No SOB. Gastrointestinal: No abdominal pain.  No nausea, no vomiting.  No diarrhea.  No constipation. Musculoskeletal: Negative for musculoskeletal pain. Skin: Negative for rash, abrasions, lacerations, ecchymosis. Neurological: Negative for headaches.   ____________________________________________   PHYSICAL EXAM:  VITAL SIGNS: ED Triage Vitals  Enc Vitals Group     BP 03/23/18 1249 Marland Kitchen(!)  189/89     Pulse Rate 03/23/18 1249 68     Resp 03/23/18 1401 12     Temp 03/23/18 1249 97.9 F (36.6 C)     Temp Source 03/23/18 1249 Oral     SpO2 03/23/18 1249 100 %     Weight 03/23/18 1250 140 lb (63.5 kg)     Height 03/23/18 1250 5\' 2"  (1.575 m)     Head Circumference --      Peak Flow --      Pain Score 03/23/18 1250 0      Pain Loc --      Pain Edu? --      Excl. in GC? --      Constitutional: Alert and oriented. Well appearing and in no acute distress. Eyes: Conjunctivae are normal. PERRL. EOMI. No discharge. Head: Atraumatic. ENT: No frontal and maxillary sinus tenderness.      Ears: Tympanic membranes pearly gray with good landmarks. No discharge.      Nose: No congestion/rhinnorhea.      Mouth/Throat: Mucous membranes are moist. Oropharynx non-erythematous. Tonsils not enlarged. No exudates. Uvula midline. Neck: No stridor.   Hematological/Lymphatic/Immunilogical: No cervical lymphadenopathy. Cardiovascular: Normal rate, regular rhythm.  Good peripheral circulation. Respiratory: Normal respiratory effort without tachypnea or retractions. Lungs CTAB. Good air entry to the bases with no decreased or absent breath sounds. Gastrointestinal: Bowel sounds 4 quadrants. Soft and nontender to palpation. No guarding or rigidity. No palpable masses. No distention. Musculoskeletal: Full range of motion to all extremities. No gross deformities appreciated. Neurologic:  Normal speech and language. No gross focal neurologic deficits are appreciated.  Skin:  Skin is warm, dry and intact. No rash noted. Psychiatric: Mood and affect are normal. Speech and behavior are normal. Patient exhibits appropriate insight and judgement.   ____________________________________________   LABS (all labs ordered are listed, but only abnormal results are displayed)  Labs Reviewed - No data to display ____________________________________________  EKG   ____________________________________________  RADIOLOGY Lexine Baton, personally viewed and evaluated these images (plain radiographs) as part of my medical decision making, as well as reviewing the written report by the radiologist.  Dg Chest 2 View  Result Date: 03/23/2018 CLINICAL DATA:  Productive cough. EXAM: CHEST - 2 VIEW COMPARISON:  05/17/2013. FINDINGS:  Mediastinum and hilar structures normal. Heart size normal. Mild bilateral from interstitial prominence noted. Active process including pneumonitis could present this fashion. No pleural effusion or pneumothorax. Diffuse degenerative change thoracic spine. IMPRESSION: Mild bilateral pulmonary interstitial prominence. And active process including pneumonitis could present in this fashion. Electronically Signed   By: Maisie Fus  Register   On: 03/23/2018 13:45    ____________________________________________    PROCEDURES  Procedure(s) performed:    Procedures    Medications  cefTRIAXone (ROCEPHIN) injection 1 g (1 g Intramuscular Given 03/23/18 1407)  methylPREDNISolone sodium succinate (SOLU-MEDROL) 125 mg/2 mL injection 125 mg (125 mg Intramuscular Given 03/23/18 1407)  ipratropium-albuterol (DUONEB) 0.5-2.5 (3) MG/3ML nebulizer solution 3 mL (3 mLs Nebulization Given 03/23/18 1407)     ____________________________________________   INITIAL IMPRESSION / ASSESSMENT AND PLAN / ED COURSE  Pertinent labs & imaging results that were available during my care of the patient were reviewed by me and considered in my medical decision making (see chart for details).  Review of the [place] CSRS was performed in accordance of the [facility] prior to dispensing any controlled drugs.     Patient's diagnosis is consistent with pneumonia. Vital signs and exam are reassuring.  Chest x-ray concerning for pneumonitis.  IM ceftriaxone and IM Solu-Medrol were given.  Blood pressure 167/92 at recheck.  Blood pressure was elevated similarly during visit in March.  Patient denies any symptoms: Headache, chest pain, shortness of breath.  Patient is agreeable to follow-up with primary care for recheck and to return to the emergency department for any symptoms.  Patient appears well and is staying well hydrated. Patient feels comfortable going home. Patient will be discharged home with prescriptions for azithromycin,  prednisone and albuterol inhaler. Patient is to follow up with PCP as needed or otherwise directed. Patient is given ED precautions to return to the ED for any worsening or new symptoms.     ____________________________________________  FINAL CLINICAL IMPRESSION(S) / ED DIAGNOSES  Final diagnoses:  Blood pressure elevated without history of HTN  Community acquired pneumonia, unspecified laterality      NEW MEDICATIONS STARTED DURING THIS VISIT:  ED Discharge Orders         Ordered    azithromycin (ZITHROMAX Z-PAK) 250 MG tablet     03/23/18 1416    benzonatate (TESSALON PERLES) 100 MG capsule  3 times daily PRN     03/23/18 1416    predniSONE (DELTASONE) 10 MG tablet     03/23/18 1416    albuterol (PROVENTIL HFA;VENTOLIN HFA) 108 (90 Base) MCG/ACT inhaler  Every 6 hours PRN     03/23/18 1416    Blood Pressure Monitoring (BLOOD PRESSURE CUFF) MISC  3 times daily     03/23/18 1416              This chart was dictated using voice recognition software/Dragon. Despite best efforts to proofread, errors can occur which can change the meaning. Any change was purely unintentional.    Enid DerryWagner, Ellery Tash, PA-C 03/23/18 Alease [id number]    Siadecki, Sebastian, MD 03/23/18 1949

## 2018-03-23 NOTE — Discharge Instructions (Signed)
Your chest x-ray shows that you may be starting to develop pneumonia.  You were given an antibiotic shot and a steroid shot in the emergency department.  Your blood pressure was elevated in the emergency department.  Please follow-up with primary care as soon as possible for reevaluation.  You can keep a log of your blood pressure at home to discuss with your primary care provider.  Please return the emergency department immediately for headache, visual changes, shortness of breath, chest pain.

## 2019-10-24 ENCOUNTER — Emergency Department
Admission: EM | Admit: 2019-10-24 | Discharge: 2019-10-24 | Disposition: A | Discharge status: Home / Self Care | Payer: PRIVATE HEALTH INSURANCE | Attending: Emergency Medicine | Admitting: Emergency Medicine

## 2019-10-24 ENCOUNTER — Other Ambulatory Visit: Payer: Self-pay

## 2019-10-24 DIAGNOSIS — F17200 Nicotine dependence, unspecified, uncomplicated: Secondary | ICD-10-CM | POA: Insufficient documentation

## 2019-10-24 DIAGNOSIS — R531 Weakness: Secondary | ICD-10-CM | POA: Insufficient documentation

## 2019-10-24 DIAGNOSIS — R110 Nausea: Secondary | ICD-10-CM | POA: Insufficient documentation

## 2019-10-24 LAB — COMPREHENSIVE METABOLIC PANEL
ALT: 11 U/L (ref 0–44)
AST: 18 U/L (ref 15–41)
Albumin: 4.1 g/dL (ref 3.5–5.0)
Alkaline Phosphatase: 73 U/L (ref 38–126)
Anion gap: 12 (ref 5–15)
BUN: 11 mg/dL (ref 6–20)
CO2: 24 mmol/L (ref 22–32)
Calcium: 9.2 mg/dL (ref 8.9–10.3)
Chloride: 100 mmol/L (ref 98–111)
Creatinine, Ser: 0.74 mg/dL (ref 0.44–1.00)
GFR calc Af Amer: >60 mL/min (ref >60)
GFR calc non Af Amer: >60 mL/min (ref >60)
Glucose, Bld: 141 mg/dL — ABNORMAL HIGH (ref 70–99)
Potassium: 3.5 mmol/L (ref 3.5–5.1)
Sodium: 136 mmol/L (ref 135–145)
Total Bilirubin: 0.8 mg/dL (ref 0.3–1.2)
Total Protein: 7.5 g/dL (ref 6.5–8.1)

## 2019-10-24 LAB — CBC
HCT: 42.9 % (ref 36.0–46.0)
Hemoglobin: 14 g/dL (ref 12.0–15.0)
MCH: 28.9 pg (ref 26.0–34.0)
MCHC: 32.6 g/dL (ref 30.0–36.0)
MCV: 88.6 fL (ref 80.0–100.0)
Platelets: 228 10*3/uL (ref 150–400)
RBC: 4.84 MIL/uL (ref 3.87–5.11)
RDW: 13.2 % (ref 11.5–15.5)
WBC: 5.8 10*3/uL (ref 4.0–10.5)
nRBC: 0 % (ref 0.0–0.2)

## 2019-10-24 LAB — LIPASE, BLOOD: Lipase: 31 U/L (ref 11–51)

## 2019-10-24 MED ORDER — SODIUM CHLORIDE 0.9% FLUSH: 3.0000 mL | Freq: Once | INTRAVENOUS | Status: DC

## 2019-10-24 MED ORDER — ONDANSETRON HCL 4 MG PO TABS: 4.0000 mg | ORAL_TABLET | Freq: Three times a day (TID) | ORAL | 1 refills | Status: DC | PRN

## 2019-10-24 NOTE — ED Notes (Addendum)
St N/Vonce since this morning along with a HA/dizziness. Denies taking OTC at home.  Denies Cp/SHOB/abd pain at this time. Pt drinking water at bedside at this time. Pt denies nausea at this time. Requesting "graham crackers, I feel better now".   This RN advised pt has to be seen by EDP before giving her food.

## 2019-10-24 NOTE — ED Notes (Signed)
Pt st "I feel wonderful after I ate my graham crackers and PB". Pt denies painN/V at this time. Pt ambulatory with a steady gait.

## 2019-10-24 NOTE — ED Notes (Signed)
Pt provided with graham crackers per MD request.

## 2019-10-24 NOTE — ED Triage Notes (Signed)
Pt c/o nausea and HA that began this AM. States lives with grandson who is also being seen for possible bronchitis. A&O, ambulatory. No distress noted. Denies vomiting.

## 2019-10-24 NOTE — ED Provider Notes (Signed)
[facility] Emergency Department Provider Note ____________________________________________   First MD Initiated Contact with Patient 10/24/19 1202     (approximate)  I have reviewed the triage vital signs and the nursing notes.  HISTORY  Chief Complaint Nausea and Headache   HPI Sonya Wallace is a 59 y.o. female ED with nausea and generalized sensation of feeling poorly.    Patient denies medical history and takes no prescription medications regularly.  She reports she is here with her nephew, who she lives with, who was recently diagnosed with bronchitis.   Patient reports being in her typical state of health until this morning when she woke up "feeling badly."  She is a difficult time explaining specifically what she feels, but does endorse nausea and dizziness.  She denies vertigo with a sensation that she will pass out, but feels "a little bit dizzy."    She denies fevers syncope, vertigo, chest pain, shortness of breath, cough, productive cough, abdominal pain, dysuria, diarrhea, vomiting or rash.  She quickly indicates that she would like a work note to take 2 days off from work for "bedrest."  History reviewed. No pertinent past medical history.  There are no problems to display for this patient.   History reviewed. No pertinent surgical history.  Prior to Admission medications   Medication Sig Start Date End Date Taking? Authorizing Provider  albuterol (PROVENTIL HFA;VENTOLIN HFA) 108 (90 Base) MCG/ACT inhaler Inhale 2 puffs into the lungs every 6 (six) hours as needed for wheezing or shortness of breath. 03/23/18   Enid Derry, PA-C  azithromycin (ZITHROMAX Z-PAK) 250 MG tablet Take 2 tablets (500 mg) on  Day 1,  followed by 1 tablet (250 mg) once daily on Days 2 through 5. 03/23/18   Enid Derry, PA-C  Blood Pressure Monitoring (BLOOD PRESSURE CUFF) MISC 1 Units by Does not apply route 3 (three) times daily. 03/23/18   Enid Derry,  PA-C  chlorpheniramine-HYDROcodone (TUSSIONEX PENNKINETIC ER) 10-8 MG/5ML SUER Take 5 mLs by mouth 2 (two) times daily. Patient not taking: Reported on 04/07/2017 11/24/16   Joni Reining, PA-C  methylPREDNISolone (MEDROL DOSEPAK) 4 MG TBPK tablet Take Tapered dose as directed Patient not taking: Reported on 04/07/2017 11/24/16   Joni Reining, PA-C  ondansetron (ZOFRAN ODT) 4 MG disintegrating tablet Take 1 tablet (4 mg total) by mouth every 8 (eight) hours as needed for nausea or vomiting. 06/29/17   Rockne Menghini, MD  ondansetron (ZOFRAN) 4 MG tablet Take 1 tablet (4 mg total) by mouth every 8 (eight) hours as needed for nausea or vomiting. 10/24/19 10/23/20  Delton Prairie, MD  predniSONE (DELTASONE) 10 MG tablet Take 6 tablets on day 1, take 5 tablets on day 2, take 4 tablets on day 3, take 3 tablets on day 4, take 2 tablets on day 5, take 1 tablet on day 6 03/23/18   Enid Derry, PA-C  sulfamethoxazole-trimethoprim (BACTRIM DS,SEPTRA DS) 800-160 MG tablet Take 1 tablet by mouth 2 (two) times daily. 06/29/17   Rockne Menghini, MD    Allergies Patient has no known allergies.  History reviewed. No pertinent family history.  Social History Social History   Tobacco Use  . Smoking status: Current Every Day Smoker  . Smokeless tobacco: Never Used  Substance Use Topics  . Alcohol use: No  . Drug use: No    Review of Systems  Constitutional: No fever/chills Eyes: No visual changes. ENT: No sore throat. Cardiovascular: Denies chest pain. Respiratory: Denies shortness of  breath. Gastrointestinal: No abdominal pain.  No nausea, no vomiting.  No diarrhea.  No constipation. Genitourinary: Negative for dysuria. Musculoskeletal: Negative for back pain. Skin: Negative for rash. Neurological: Negative for headaches, focal weakness or numbness.   ____________________________________________   PHYSICAL EXAM:  VITAL SIGNS: ED Triage Vitals [10/24/19 0935]  Enc Vitals Group       BP (!) 167/92     Pulse Rate 62     Resp 16     Temp 97.6 F (36.4 C)     Temp Source Oral     SpO2 100 %     Weight 130 lb (59 kg)     Height 5\' 2"  (1.575 m)     Head Circumference      Peak Flow      Pain Score 0     Pain Loc      Pain Edu?      Excl. in GC?     Constitutional: Alert and oriented. Well appearing and in no acute distress. Eyes: Conjunctivae are normal. PERRL. EOMI. Head: Atraumatic. Nose: No congestion/rhinnorhea. Mouth/Throat: Mucous membranes are moist.  Oropharynx non-erythematous. Neck: No stridor. No cervical spine tenderness to palpation. Cardiovascular: Normal rate, regular rhythm. Grossly normal heart sounds.  Good peripheral circulation. Respiratory: Normal respiratory effort.  No retractions. Lungs CTAB. Gastrointestinal: Soft , nondistended, nontender to palpation. No abdominal bruits. No CVA tenderness. Musculoskeletal: No lower extremity tenderness nor edema.  No joint effusions. No signs of acute trauma. Neurologic:  Normal speech and language. No gross focal neurologic deficits are appreciated. No gait instability noted. Skin:  Skin is warm, dry and intact. No rash noted. Psychiatric: Mood and affect are normal. Speech and behavior are normal.  ____________________________________________   LABS (all labs ordered are listed, but only abnormal results are displayed)  Labs Reviewed  COMPREHENSIVE METABOLIC PANEL - Abnormal; Notable for the following components:      Result Value   Glucose, Bld 141 (*)    All other components within normal limits  LIPASE, BLOOD  CBC   ____________________________________________  12 Lead EKG   ____________________________________________  RADIOLOGY  ED MD interpretation:    Official radiology report(s): No results found.  ____________________________________________   PROCEDURES  Procedure(s) performed (including Critical  Care):  Procedures   ____________________________________________   INITIAL IMPRESSION / ASSESSMENT AND PLAN / ED COURSE  Otherwise healthy 59 year old man presenting with vague and mild complaints without evidence of acute pathology amenable to outpatient management.  Normal vital signs.  Exam without evidence of acute pathology.  Unremarkable blood work.  Tolerating p.o. intake and requesting a work note.  I see no evidence of significant acute pathology to warrant further work-up in the ED or management as an inpatient.  Advised follow-up with her PCP as an outpatient within the next week.  Work notes and as needed Zofran prescription provided.  Return precautions for the ED were discussed.  Patient medically stable for discharge home.  ____________________________________________   FINAL CLINICAL IMPRESSION(S) / ED DIAGNOSES  Final diagnoses:  Nausea  Generalized weakness     ED Discharge Orders         Ordered    ondansetron (ZOFRAN) 4 MG tablet  Every 8 hours PRN     Discontinue  Reprint     10/24/19 1223           Joslyne Marshburn   Note:  This document was prepared using 10/26/19 and may include unintentional dictation errors.   Conservation officer, historic buildings,  Domingo Dimes, MD 10/24/19 [phone]

## 2019-10-24 NOTE — Discharge Instructions (Signed)
Return to the ED with any fevers or worsening symptoms despite the medications.

## 2020-02-25 ENCOUNTER — Encounter: Payer: Self-pay | Admitting: Medical Oncology

## 2020-02-25 ENCOUNTER — Other Ambulatory Visit: Payer: Self-pay

## 2020-02-25 ENCOUNTER — Emergency Department: Payer: Self-pay

## 2020-02-25 ENCOUNTER — Emergency Department
Admission: EM | Admit: 2020-02-25 | Discharge: 2020-02-25 | Disposition: A | Discharge status: Home / Self Care | Payer: Self-pay | Attending: Emergency Medicine | Admitting: Emergency Medicine

## 2020-02-25 DIAGNOSIS — R300 Dysuria: Secondary | ICD-10-CM | POA: Insufficient documentation

## 2020-02-25 DIAGNOSIS — B9689 Other specified bacterial agents as the cause of diseases classified elsewhere: Secondary | ICD-10-CM | POA: Insufficient documentation

## 2020-02-25 DIAGNOSIS — J069 Acute upper respiratory infection, unspecified: Secondary | ICD-10-CM | POA: Insufficient documentation

## 2020-02-25 DIAGNOSIS — N390 Urinary tract infection, site not specified: Secondary | ICD-10-CM | POA: Insufficient documentation

## 2020-02-25 DIAGNOSIS — R350 Frequency of micturition: Secondary | ICD-10-CM | POA: Insufficient documentation

## 2020-02-25 DIAGNOSIS — F17200 Nicotine dependence, unspecified, uncomplicated: Secondary | ICD-10-CM | POA: Insufficient documentation

## 2020-02-25 LAB — URINALYSIS, ROUTINE W REFLEX MICROSCOPIC
Bilirubin Urine: NEGATIVE
Glucose, UA: NEGATIVE mg/dL
Ketones, ur: NEGATIVE mg/dL
Nitrite: NEGATIVE
Protein, ur: NEGATIVE mg/dL
Specific Gravity, Urine: 1.006 (ref 1.005–1.030)
pH: 5 (ref 5.0–8.0)

## 2020-02-25 MED ORDER — BENZONATATE 100 MG PO CAPS: 200.0000 mg | ORAL_CAPSULE | Freq: Three times a day (TID) | ORAL | 0 refills | Status: DC | PRN

## 2020-02-25 MED ORDER — BENZONATATE 100 MG PO CAPS
200.0000 mg | ORAL_CAPSULE | Freq: Once | ORAL | Status: AC
  Administered 2020-02-25: 200 mg via ORAL
  Filled 2020-02-25: qty 2

## 2020-02-25 MED ORDER — CLARITHROMYCIN 500 MG PO TABS: 500.0000 mg | ORAL_TABLET | Freq: Two times a day (BID) | ORAL | 0 refills | Status: AC

## 2020-02-25 MED ORDER — HYDROCOD POLST-CPM POLST ER 10-8 MG/5ML PO SUER
5.0000 mL | Freq: Once | ORAL | Status: DC
  Filled 2020-02-25: qty 5

## 2020-02-25 NOTE — ED Triage Notes (Signed)
Pt reports upper resp sx's and UTI sx's for a few days. Pt has had UTI in past and this feels exactly the same.

## 2020-02-25 NOTE — ED Notes (Signed)
Here for a bladder infection and URI. Conscious/alert, denies any pain.

## 2020-02-25 NOTE — ED Notes (Signed)
Patient refused covid test. States she only wants treatment for URI.

## 2020-02-25 NOTE — Discharge Instructions (Signed)
Follow discharge care instruction take medication as directed. °

## 2020-02-25 NOTE — ED Provider Notes (Signed)
[facility] Emergency Department Provider Note   ____________________________________________   First MD Initiated Contact with Patient 02/25/20 1125     (approximate)  I have reviewed the triage vital signs and the nursing notes.   HISTORY  Chief Complaint Nasal Congestion and Urinary Tract Infection    HPI Sonya Wallace is a 59 y.o. female patient presents with nonproductive cough, nasal congestion, and body aches for 3 days.  Patient denies fever or chest pain.  Patient denies recent travel or known contact with COVID-19.  Patient says she refused to have the COVID-19 vaccine.  Patient also complain urinary frequency and dysuria.  Patient denies vaginal discharge.         History reviewed. No pertinent past medical history.  There are no problems to display for this patient.   History reviewed. No pertinent surgical history.  Prior to Admission medications   Medication Sig Start Date End Date Taking? Authorizing Provider  albuterol (PROVENTIL HFA;VENTOLIN HFA) 108 (90 Base) MCG/ACT inhaler Inhale 2 puffs into the lungs every 6 (six) hours as needed for wheezing or shortness of breath. 03/23/18   Enid Derry, PA-C  azithromycin (ZITHROMAX Z-PAK) 250 MG tablet Take 2 tablets (500 mg) on  Day 1,  followed by 1 tablet (250 mg) once daily on Days 2 through 5. 03/23/18   Enid Derry, PA-C  benzonatate (TESSALON PERLES) 100 MG capsule Take 2 capsules (200 mg total) by mouth 3 (three) times daily as needed. 02/25/20 02/24/21  Joni Reining, PA-C  Blood Pressure Monitoring (BLOOD PRESSURE CUFF) MISC 1 Units by Does not apply route 3 (three) times daily. 03/23/18   Enid Derry, PA-C  chlorpheniramine-HYDROcodone (TUSSIONEX PENNKINETIC ER) 10-8 MG/5ML SUER Take 5 mLs by mouth 2 (two) times daily. Patient not taking: Reported on 04/07/2017 11/24/16   Joni Reining, PA-C  clarithromycin (BIAXIN) 500 MG tablet Take 1 tablet (500 mg total) by mouth 2  (two) times daily for 14 days. 02/25/20 03/10/20  Joni Reining, PA-C  methylPREDNISolone (MEDROL DOSEPAK) 4 MG TBPK tablet Take Tapered dose as directed Patient not taking: Reported on 04/07/2017 11/24/16   Joni Reining, PA-C  ondansetron (ZOFRAN ODT) 4 MG disintegrating tablet Take 1 tablet (4 mg total) by mouth every 8 (eight) hours as needed for nausea or vomiting. 06/29/17   Rockne Menghini, MD  ondansetron (ZOFRAN) 4 MG tablet Take 1 tablet (4 mg total) by mouth every 8 (eight) hours as needed for nausea or vomiting. 10/24/19 10/23/20  Delton Prairie, MD  predniSONE (DELTASONE) 10 MG tablet Take 6 tablets on day 1, take 5 tablets on day 2, take 4 tablets on day 3, take 3 tablets on day 4, take 2 tablets on day 5, take 1 tablet on day 6 03/23/18   Enid Derry, PA-C  sulfamethoxazole-trimethoprim (BACTRIM DS,SEPTRA DS) 800-160 MG tablet Take 1 tablet by mouth 2 (two) times daily. 06/29/17   Rockne Menghini, MD    Allergies Patient has no known allergies.  No family history on file.  Social History Social History   Tobacco Use  . Smoking status: Current Every Day Smoker  . Smokeless tobacco: Never Used  Substance Use Topics  . Alcohol use: No  . Drug use: No    Review of Systems Constitutional: No fever/chills Eyes: No visual changes. ENT: Sore throat. Cardiovascular: Denies chest pain. Respiratory: Denies shortness of breath. Gastrointestinal: No abdominal pain.  No nausea, no vomiting.  No diarrhea.  No constipation. Genitourinary: Positive for  dysuria. Musculoskeletal: Negative for back pain. Skin: Negative for rash. Neurological: Negative for headaches, focal weakness or numbness.   ____________________________________________   PHYSICAL EXAM:  VITAL SIGNS: ED Triage Vitals  Enc Vitals Group     BP 02/25/20 1032 (!) 165/98     Pulse Rate 02/25/20 1032 87     Resp 02/25/20 1032 18     Temp 02/25/20 1032 98.7 F (37.1 C)     Temp Source 02/25/20 1032  Oral     SpO2 02/25/20 1032 96 %     Weight 02/25/20 1033 125 lb (56.7 kg)     Height 02/25/20 1033 5\' 2"  (1.575 m)     Head Circumference --      Peak Flow --      Pain Score 02/25/20 1033 0     Pain Loc --      Pain Edu? --      Excl. in GC? --    Constitutional: Alert and oriented. Well appearing and in no acute distress. Eyes: Conjunctivae are normal. PERRL. EOMI. Head: Atraumatic. Nose: No congestion/rhinnorhea. Mouth/Throat: Mucous membranes are moist.  Oropharynx non-erythematous. Hematological/Lymphatic/Immunilogical: No cervical lymphadenopathy. Cardiovascular: Normal rate, regular rhythm. Grossly normal heart sounds.  Good peripheral circulation.  Elevated blood pressure. Respiratory: Normal respiratory effort.  No retractions. Lungs CTAB. Gastrointestinal: Soft and nontender. No distention. No abdominal bruits. No CVA tenderness. Genitourinary: Deferred Musculoskeletal: No lower extremity tenderness nor edema.  No joint effusions. Neurologic:  Normal speech and language. No gross focal neurologic deficits are appreciated. No gait instability. Skin:  Skin is warm, dry and intact. No rash noted. Psychiatric: Mood and affect are normal. Speech and behavior are normal.  ____________________________________________   LABS (all labs ordered are listed, but only abnormal results are displayed)  Labs Reviewed  URINALYSIS, ROUTINE W REFLEX MICROSCOPIC - Abnormal; Notable for the following components:      Result Value   Color, Urine YELLOW (*)    APPearance CLOUDY (*)    Hgb urine dipstick SMALL (*)    Leukocytes,Ua MODERATE (*)    Bacteria, UA MANY (*)    All other components within normal limits  URINE CULTURE   ____________________________________________  EKG   ____________________________________________  RADIOLOGY I, 02/27/20, personally viewed and evaluated these images (plain radiographs) as part of my medical decision making, as well as reviewing the  written report by the radiologist.  ED MD interpretation: No acute findings on chest x-ray.  Official radiology report(s): DG Chest Portable 1 View  Result Date: 02/25/2020 CLINICAL DATA:  Nonproductive cough. EXAM: PORTABLE CHEST 1 VIEW COMPARISON:  03/23/2018 FINDINGS: The heart size and mediastinal contours are within normal limits. No consolidation. Mild interstitial prominence, which appears chronic. No visible pleural effusions or pneumothorax. No acute osseous abnormality. IMPRESSION: Mild interstitial prominence, which appears similar to 2019 and therefore is favored chronic. Atypical infection is a differential consideration. Electronically Signed   By: 2020 MD   On: 02/25/2020 12:05    ____________________________________________   PROCEDURES  Procedure(s) performed (including Critical Care):  Procedures   ____________________________________________   INITIAL IMPRESSION / ASSESSMENT AND PLAN / ED COURSE  As part of my medical decision making, I reviewed the following data within the electronic MEDICAL RECORD NUMBER       Patient presents with respiratory infection but refused to COVID-19 or flu test.  Discussed urinalysis consistent with urinary tract infection.  Chest x-rays consistent with atypical respiratory infection.  Patient given discharge care instruction prescription  for Biaxin and Tessalon.  Urine culture is pending.  Advised to follow-up with PCP.      ____________________________________________   FINAL CLINICAL IMPRESSION(S) / ED DIAGNOSES  Final diagnoses:  Lower urinary tract infectious disease  Upper respiratory tract infection, unspecified type     ED Discharge Orders         Ordered    clarithromycin (BIAXIN) 500 MG tablet  2 times daily        02/25/20 1219    benzonatate (TESSALON PERLES) 100 MG capsule  3 times daily PRN        02/25/20 1219          *Please note:  Sonya Wallace was evaluated in Emergency Department on  02/25/2020 for the symptoms described in the history of present illness. She was evaluated in the context of the global COVID-19 pandemic, which necessitated consideration that the patient might be at risk for infection with the SARS-CoV-2 virus that causes COVID-19. Institutional protocols and algorithms that pertain to the evaluation of patients at risk for COVID-19 are in a state of rapid change based on information released by regulatory bodies including the CDC and federal and state organizations. These policies and algorithms were followed during the patient's care in the ED.  Some ED evaluations and interventions may be delayed as a result of limited staffing during and the pandemic.*   Note:  This document was prepared using Dragon voice recognition software and may include unintentional dictation errors.    Joni Reining, PA-C 02/25/20 1223    Arnaldo Natal, MD 02/25/20 [phone]

## 2020-02-27 LAB — URINE CULTURE
Culture: >=100000 — AB
Special Requests: NORMAL

## 2020-02-28 NOTE — Progress Notes (Signed)
ED Antimicrobial Stewardship Positive Culture Follow Up   Sonya Wallace is an 59 y.o. female who presented to [facility] on 02/25/2020 with a chief complaint of nasal congestion and UTI. Pt with nonproductive cough, nasal congestion, and body aches for 3 days and also complaining of urinary frequency and dysuria; UA WBC 21-50. During ED visit pt only wanted to be treated for her URI.   Called and left patient voicemail on 11/25 to call back for lab result. Discussed with ED physician who approved prescription for cephalexin should the patient want antibiotics for her UTI.    Chief Complaint  Patient presents with  . Nasal Congestion  . Urinary Tract Infection    Recent Results (from the past 720 hour(s))  Urine culture     Status: Abnormal   Collection Time: 02/25/20 10:40 AM   Specimen: Urine, Clean Catch  Result Value Ref Range Status   Specimen Description   Final    URINE, CLEAN CATCH Performed at [facility], [address], [place], [place] [address]    Special Requests   Final    Normal Performed at [facility], [address] [place], [place] [address]    Culture >=100,000 COLONIES/mL ESCHERICHIA COLI (A)  Final   Report Status 02/27/2020 FINAL  Final   Organism ID, Bacteria ESCHERICHIA COLI (A)  Final      Susceptibility   Escherichia coli - MIC*    AMPICILLIN 8 SENSITIVE Sensitive     CEFAZOLIN <=4 SENSITIVE Sensitive     CEFEPIME <=0.12 SENSITIVE Sensitive     CEFTRIAXONE <=0.25 SENSITIVE Sensitive     CIPROFLOXACIN <=0.25 SENSITIVE Sensitive     GENTAMICIN <=1 SENSITIVE Sensitive     IMIPENEM <=0.25 SENSITIVE Sensitive     NITROFURANTOIN <=16 SENSITIVE Sensitive     TRIMETH/SULFA <=20 SENSITIVE Sensitive     AMPICILLIN/SULBACTAM 4 SENSITIVE Sensitive     PIP/TAZO <=4 SENSITIVE Sensitive     * >=100,000 COLONIES/mL ESCHERICHIA COLI    New antibiotic prescription: Cephalexin 500mg  TID x5 dys  ED Provider: Dr.  Adaline Sill, PharmD Pharmacy Resident  02/28/2020 1:10 PM

## 2020-02-29 NOTE — Progress Notes (Signed)
PHARMACY - PHYSICIAN COMMUNICATION BLOOD CULTURE IDENTIFICATION (BCID) SENSITIVITY   BCx sensitivities received following pt DC on Clarithromycin, showing UTI (E. Coli) sensitive to Keflex.  Name of physician (or Provider) Contacted: Delton Prairie  Changes to prescribed antibiotics required: Approved new Rx for Keflex 500mg  PO TID x 5 days.  Pt left msg on 11/25 and returned call to pharmacy this AM.  Pt stated she felt the clarithromycin was treating her infection sufficiently based on improvement in sx and did not want to change abx at this time.  Agreed to have medication called in to [facility]25, [place] and placed on hold and will call [facility] to fill and pick up should sx stop improving or worsen.  Called [facility] and gave verbal script to RPh on duty at appox 10:20.  Cheree Ditto, PharmD, [facility] 02/29/2020 10:35 AM

## 2020-07-17 ENCOUNTER — Emergency Department
Admission: EM | Admit: 2020-07-17 | Discharge: 2020-07-17 | Disposition: A | Discharge status: Home / Self Care | Payer: Self-pay | Attending: Emergency Medicine | Admitting: Emergency Medicine

## 2020-07-17 ENCOUNTER — Emergency Department: Payer: Self-pay

## 2020-07-17 ENCOUNTER — Encounter: Payer: Self-pay | Admitting: Emergency Medicine

## 2020-07-17 ENCOUNTER — Other Ambulatory Visit: Payer: Self-pay

## 2020-07-17 DIAGNOSIS — J441 Chronic obstructive pulmonary disease with (acute) exacerbation: Secondary | ICD-10-CM | POA: Insufficient documentation

## 2020-07-17 DIAGNOSIS — J40 Bronchitis, not specified as acute or chronic: Secondary | ICD-10-CM

## 2020-07-17 DIAGNOSIS — J209 Acute bronchitis, unspecified: Secondary | ICD-10-CM | POA: Insufficient documentation

## 2020-07-17 DIAGNOSIS — R059 Cough, unspecified: Secondary | ICD-10-CM

## 2020-07-17 DIAGNOSIS — F17200 Nicotine dependence, unspecified, uncomplicated: Secondary | ICD-10-CM | POA: Insufficient documentation

## 2020-07-17 MED ORDER — PREDNISONE 50 MG PO TABS: 50.0000 mg | ORAL_TABLET | Freq: Every day | ORAL | 0 refills | Status: AC

## 2020-07-17 MED ORDER — DOXYCYCLINE HYCLATE 100 MG PO TABS: 100.0000 mg | ORAL_TABLET | Freq: Two times a day (BID) | ORAL | 0 refills | Status: AC

## 2020-07-17 MED ORDER — BENZONATATE 100 MG PO CAPS: 100.0000 mg | ORAL_CAPSULE | Freq: Four times a day (QID) | ORAL | 0 refills | Status: DC | PRN

## 2020-07-17 NOTE — Discharge Instructions (Addendum)
You are being discharged with prescriptions for Prednisone steroids to help open up your lungs because of your smoking. Doxycycline antibiotic to take twice daily for the next 5 days because of your smoking history andProducing mucus with your cough. Tessalon Perles to help with your cough.  If you develop new or further worsening symptoms despite these medications, please return to the ED.

## 2020-07-17 NOTE — ED Triage Notes (Signed)
Pt comes into the ED via POV c/o cough and possible URI.  Pt has even and unlabored respirations at this time and is ambulatory to triage at this time.  Pt states the mucous is green in color.

## 2020-07-17 NOTE — ED Notes (Signed)
D/C and new RX discussed with pt, pt verbalized undersensing. NAD noted. Pt ambulatory on D/C with steady gait.

## 2020-07-17 NOTE — ED Provider Notes (Signed)
[facility] Emergency Department Provider Note ____________________________________________   Event Date/Time   First MD Initiated Contact with Patient 07/17/20 1101     (approximate)  I have reviewed the triage vital signs and the nursing notes.  HISTORY  Chief Complaint Cough   HPI Sonya Wallace is a 60 y.o. femalewho presents to the ED for evaluation of cough  Chart review indicates no relevant medical history. Patient ports a lifelong smoking history, but has been cutting back from 2 PPD to 1 PPD.  She presents to the ED with 6-7 days of upper respiratory congestion and cough.  She reports her cough is productive of green and yellow sputum.  She reports mild shortness of breath, but minimizes this and indicates primarily a cough.  She is requesting Lawyer.  She reports that she does not have an inhaler at home and refuses to use inhalers.  She denies any fevers, chest pain or pressure, abdominal pain, emesis, syncope.  She reports feeling fine right now, but is requesting help with her cough.   History reviewed. No pertinent past medical history.  There are no problems to display for this patient.   History reviewed. No pertinent surgical history.  Prior to Admission medications   Medication Sig Start Date End Date Taking? Authorizing Provider  benzonatate (TESSALON PERLES) 100 MG capsule Take 1 capsule (100 mg total) by mouth every 6 (six) hours as needed for cough. 07/17/20 07/17/21 Yes Delton Prairie, MD  doxycycline (VIBRA-TABS) 100 MG tablet Take 1 tablet (100 mg total) by mouth 2 (two) times daily for 5 days. 07/17/20 07/22/20 Yes Delton Prairie, MD  predniSONE (DELTASONE) 50 MG tablet Take 1 tablet (50 mg total) by mouth daily for 5 days. 07/17/20 07/22/20 Yes Delton Prairie, MD  albuterol (PROVENTIL HFA;VENTOLIN HFA) 108 (90 Base) MCG/ACT inhaler Inhale 2 puffs into the lungs every 6 (six) hours as needed for wheezing or shortness of breath.  03/23/18   Enid Derry, PA-C  Blood Pressure Monitoring (BLOOD PRESSURE CUFF) MISC 1 Units by Does not apply route 3 (three) times daily. 03/23/18   Enid Derry, PA-C    Allergies Patient has no known allergies.  History reviewed. No pertinent family history.  Social History Social History   Tobacco Use  . Smoking status: Current Every Day Smoker  . Smokeless tobacco: Never Used  Substance Use Topics  . Alcohol use: No  . Drug use: No    Review of Systems  Constitutional: No fever/chills Eyes: No visual changes. ENT: No sore throat. Cardiovascular: Denies chest pain. Respiratory: Positive for cough and shortness of breath. Gastrointestinal: No abdominal pain.  No nausea, no vomiting.  No diarrhea.  No constipation. Genitourinary: Negative for dysuria. Musculoskeletal: Negative for back pain. Skin: Negative for rash. Neurological: Negative for headaches, focal weakness or numbness.   ____________________________________________   PHYSICAL EXAM:  VITAL SIGNS: Vitals:   07/17/20 1054  BP: (!) 145/96  Pulse: 87  Resp: 18  Temp: 98 F (36.7 C)  SpO2: 99%     Constitutional: Alert and oriented. Well appearing and in no acute distress. Eyes: Conjunctivae are normal. PERRL. EOMI. Head: Atraumatic. Nose: No congestion/rhinnorhea. Mouth/Throat: Mucous membranes are moist.  Oropharynx non-erythematous. Neck: No stridor. No cervical spine tenderness to palpation. Cardiovascular: Normal rate, regular rhythm. Grossly normal heart sounds.  Good peripheral circulation. Respiratory: Normal respiratory effort.  Diffuse expiratory wheezes with slight decrease in air movement throughout.  No focal features. Gastrointestinal: Soft , nondistended, nontender to palpation.  No CVA tenderness. Musculoskeletal: No lower extremity tenderness nor edema.  No joint effusions. No signs of acute trauma. Neurologic:  Normal speech and language. No gross focal neurologic deficits are  appreciated. No gait instability noted. Skin:  Skin is warm, dry and intact. No rash noted. Psychiatric: Mood and affect are normal. Speech and behavior are normal.  ____________________________________________  RADIOLOGY  ED MD interpretation: 2 view CXR reviewed by me with some perihilar prominence of peribronchial cuffing suggestive of viral disease, but no lobar filtration.  Official radiology report(s): No results found. _______________________________________   MDM / ED COURSE   60 year old woman with lifelong smoking history presents to the ED with isolated productive cough amenable to outpatient management with steroids and antibiotics.  Minimal hypertension, otherwise normal vitals on room air.  Exam with stigmata of COPD exacerbation with diffuse expiratory wheezes and slight decrease in air movement throughout.  Patient refuses albuterol treatments and indicates that she will not use an inhaler if prescribed at home.  She is requesting Lawyer.  We discussed prednisone or doxycycline due to her smoking history, likely COPD and productive cough.  She is agreeable.  We discussed return precautions for the ED.      ____________________________________________   FINAL CLINICAL IMPRESSION(S) / ED DIAGNOSES  Final diagnoses:  Cough  Bronchitis  COPD with acute exacerbation [facility])     ED Discharge Orders         Ordered    predniSONE (DELTASONE) 50 MG tablet  Daily        07/17/20 1143    doxycycline (VIBRA-TABS) 100 MG tablet  2 times daily        07/17/20 1143    benzonatate (TESSALON PERLES) 100 MG capsule  Every 6 hours PRN        07/17/20 1143           Phillis Thackeray   Note:  This document was prepared using Dragon voice recognition software and may include unintentional dictation errors.   Delton Prairie, MD 07/17/20 1147

## 2020-07-17 NOTE — ED Notes (Signed)
See triage note Presents with cough for about 1 week  States cough is prod   Greenish phlegm  No fever

## 2021-02-12 ENCOUNTER — Emergency Department
Admission: EM | Admit: 2021-02-12 | Discharge: 2021-02-12 | Disposition: A | Discharge status: Home / Self Care | Payer: Self-pay | Attending: Emergency Medicine | Admitting: Emergency Medicine

## 2021-02-12 ENCOUNTER — Encounter: Payer: Self-pay | Admitting: Physician Assistant

## 2021-02-12 ENCOUNTER — Other Ambulatory Visit: Payer: Self-pay

## 2021-02-12 DIAGNOSIS — F17200 Nicotine dependence, unspecified, uncomplicated: Secondary | ICD-10-CM | POA: Insufficient documentation

## 2021-02-12 DIAGNOSIS — L03211 Cellulitis of face: Secondary | ICD-10-CM | POA: Insufficient documentation

## 2021-02-12 DIAGNOSIS — B309 Viral conjunctivitis, unspecified: Secondary | ICD-10-CM | POA: Insufficient documentation

## 2021-02-12 MED ORDER — TOBRAMYCIN 0.3 % OP SOLN: 2.0000 [drp] | OPHTHALMIC | 0 refills | Status: DC

## 2021-02-12 MED ORDER — PREDNISONE 10 MG (21) PO TBPK: ORAL_TABLET | ORAL | 0 refills | Status: DC

## 2021-02-12 MED ORDER — CEPHALEXIN 500 MG PO CAPS: 500.0000 mg | ORAL_CAPSULE | Freq: Three times a day (TID) | ORAL | 0 refills | Status: DC

## 2021-02-12 NOTE — ED Provider Notes (Signed)
[facility] Emergency Department Provider Note  ____________________________________________   Event Date/Time   First MD Initiated Contact with Patient 02/12/21 1143     (approximate)  I have reviewed the triage vital signs and the nursing notes.   HISTORY  Chief Complaint Eye Pain    HPI Sonya Wallace is a 60 y.o. female presents emergency department complaining of redness around her eyes.  Awoke yesterday with matting of her eyes.  Used a different cold medication and thinks it may have caused her to turn red around her eyes.  No fever or chills.  No chest pain/shortness of breath.  Patient does have cold symptoms.  She does not want to be tested for COVID or influenza.  History reviewed. No pertinent past medical history.  There are no problems to display for this patient.   History reviewed. No pertinent surgical history.  Prior to Admission medications   Medication Sig Start Date End Date Taking? Authorizing Provider  cephALEXin (KEFLEX) 500 MG capsule Take 1 capsule (500 mg total) by mouth 3 (three) times daily. 02/12/21  Yes Aranda Bihm, Roselyn Bering, PA-C  predniSONE (STERAPRED UNI-PAK 21 TAB) 10 MG (21) TBPK tablet Take 6 pills on day one then decrease by 1 pill each day 02/12/21  Yes Shenoa Hattabaugh, Roselyn Bering, PA-C  albuterol (PROVENTIL HFA;VENTOLIN HFA) 108 (90 Base) MCG/ACT inhaler Inhale 2 puffs into the lungs every 6 (six) hours as needed for wheezing or shortness of breath. 03/23/18   Enid Derry, PA-C  benzonatate (TESSALON PERLES) 100 MG capsule Take 1 capsule (100 mg total) by mouth every 6 (six) hours as needed for cough. 07/17/20 07/17/21  Delton Prairie, MD  Blood Pressure Monitoring (BLOOD PRESSURE CUFF) MISC 1 Units by Does not apply route 3 (three) times daily. 03/23/18   Enid Derry, PA-C  tobramycin (TOBREX) 0.3 % ophthalmic solution Place 2 drops into the left eye every 4 (four) hours. 02/12/21   Faythe Ghee, PA-C    Allergies Patient has  no known allergies.  History reviewed. No pertinent family history.  Social History Social History   Tobacco Use   Smoking status: Every Day   Smokeless tobacco: Never  Substance Use Topics   Alcohol use: No   Drug use: No    Review of Systems  Constitutional: No fever/chills Eyes: No visual changes. ENT: No sore throat. Respiratory: Positive cough Cardiovascular: Denies chest pain Gastrointestinal: Denies abdominal pain Genitourinary: Negative for dysuria. Musculoskeletal: Negative for back pain. Skin: Positive for rash. Psychiatric: no mood changes,     ____________________________________________   PHYSICAL EXAM:  VITAL SIGNS: ED Triage Vitals [02/12/21 1058]  Enc Vitals Group     BP      Pulse      Resp      Temp      Temp src      SpO2      Weight 125 lb (56.7 kg)     Height 5\' 2"  (1.575 m)     Head Circumference      Peak Flow      Pain Score 6     Pain Loc      Pain Edu?      Excl. in [place]?     Constitutional: Alert and oriented. Well appearing and in no acute distress. Eyes: Conjunctivae are injected bilaterally, pinkish area noted surrounding the orbital area, extraocular motions are intact and do not reproduce any pain Head: Atraumatic. Nose: Active congestion/rhinnorhea. Mouth/Throat: Mucous membranes are moist.  Neck:  supple no lymphadenopathy noted Cardiovascular: Normal rate, regular rhythm. Heart sounds are normal Respiratory: Normal respiratory effort.  No retractions, lungs c t a  GU: deferred Musculoskeletal: FROM all extremities, warm and well perfused Neurologic:  Normal speech and language.  Skin:  Skin is warm, dry and intact. No rash noted. Psychiatric: Mood and affect are normal. Speech and behavior are normal.  ____________________________________________   LABS (all labs ordered are listed, but only abnormal results are displayed)  Labs Reviewed - No data to  display ____________________________________________   ____________________________________________  RADIOLOGY    ____________________________________________   PROCEDURES  Procedure(s) performed: No  Procedures    ____________________________________________   INITIAL IMPRESSION / ASSESSMENT AND PLAN / ED COURSE  Pertinent labs & imaging results that were available during my care of the patient were reviewed by me and considered in my medical decision making (see chart for details).   Patient is 60 year old female presents with concerns of rash around her eyes and red eyes with matting.  Thinks it may be an allergic reaction due to a new cough medication that she took.  States always take Alka-Seltzer and my daughter bought a different medication.  See HPI.  Physical exam shows patient per stable  Patient most likely has a viral conjunctivitis along with a orbital irritation, I do not feel this is a orbital cellulitis which would warrant imaging.  Patient was given a prescription for an antibiotic, eyedrops, and steroids.  She is to follow-up with her regular doctor if not improving to 3 days.  Return emergency department worsening.  She is discharged in stable condition.     Sonya Wallace was evaluated in Emergency Department on 02/12/2021 for the symptoms described in the history of present illness. She was evaluated in the context of the global COVID-19 pandemic, which necessitated consideration that the patient might be at risk for infection with the SARS-CoV-2 virus that causes COVID-19. Institutional protocols and algorithms that pertain to the evaluation of patients at risk for COVID-19 are in a state of rapid change based on information released by regulatory bodies including the CDC and federal and state organizations. These policies and algorithms were followed during the patient's care in the ED.    As part of my medical decision making, I reviewed the following  data within the electronic MEDICAL RECORD NUMBER Nursing notes reviewed and incorporated, Old chart reviewed, Notes from prior ED visits, and [place] Controlled Substance Database  ____________________________________________   FINAL CLINICAL IMPRESSION(S) / ED DIAGNOSES  Final diagnoses:  Acute viral conjunctivitis of both eyes  Cellulitis, face      NEW MEDICATIONS STARTED DURING THIS VISIT:  New Prescriptions   CEPHALEXIN (KEFLEX) 500 MG CAPSULE    Take 1 capsule (500 mg total) by mouth 3 (three) times daily.   PREDNISONE (STERAPRED UNI-PAK 21 TAB) 10 MG (21) TBPK TABLET    Take 6 pills on day one then decrease by 1 pill each day   TOBRAMYCIN (TOBREX) 0.3 % OPHTHALMIC SOLUTION    Place 2 drops into the left eye every 4 (four) hours.     Note:  This document was prepared using Dragon voice recognition software and may include unintentional dictation errors.    Faythe Ghee, PA-C 02/12/21 1201    Minna Antis, MD 02/12/21 1353

## 2021-02-12 NOTE — ED Triage Notes (Signed)
Pt comes into the ED via POV c/o bilateral eye pain.  Pt has started a new off brand cold medicine which is new and she thinks it could be an allergic reaction.  Pt has redness and a burning sensation around the eyes.  Pt states she woke up with the redness yesterday morning.  Pt in NAD.

## 2021-02-12 NOTE — ED Notes (Signed)
Pt states that, after taking a cold medicine (liquid), both eyes are swollen. She reports having to use a warm rag on the R eye in the morning to get it to open up. Redness & swelling noted around both eyes. Pt denies vision changed, blurred vision.

## 2021-02-12 NOTE — Discharge Instructions (Addendum)
[facility] for [facility] concerns. Return emergency department worsening Take medication as prescribed

## 2021-04-05 ENCOUNTER — Emergency Department: Payer: Self-pay

## 2021-04-05 ENCOUNTER — Other Ambulatory Visit: Payer: Self-pay

## 2021-04-05 ENCOUNTER — Encounter: Payer: Self-pay | Admitting: Emergency Medicine

## 2021-04-05 ENCOUNTER — Emergency Department
Admission: EM | Admit: 2021-04-05 | Discharge: 2021-04-05 | Disposition: A | Discharge status: Home / Self Care | Payer: Self-pay | Attending: Emergency Medicine | Admitting: Emergency Medicine

## 2021-04-05 DIAGNOSIS — J069 Acute upper respiratory infection, unspecified: Secondary | ICD-10-CM | POA: Insufficient documentation

## 2021-04-05 DIAGNOSIS — Z20822 Contact with and (suspected) exposure to covid-19: Secondary | ICD-10-CM | POA: Insufficient documentation

## 2021-04-05 DIAGNOSIS — Z720 Tobacco use: Secondary | ICD-10-CM | POA: Insufficient documentation

## 2021-04-05 LAB — RESP PANEL BY RT-PCR (FLU A&B, COVID) ARPGX2
Influenza A by PCR: NEGATIVE
Influenza B by PCR: NEGATIVE
SARS Coronavirus 2 by RT PCR: NEGATIVE

## 2021-04-05 MED ORDER — DOXYCYCLINE HYCLATE 100 MG PO CAPS: 100.0000 mg | ORAL_CAPSULE | Freq: Two times a day (BID) | ORAL | 0 refills | Status: AC

## 2021-04-05 MED ORDER — BENZONATATE 100 MG PO CAPS: 100.0000 mg | ORAL_CAPSULE | Freq: Four times a day (QID) | ORAL | 0 refills | Status: AC | PRN

## 2021-04-05 NOTE — ED Provider Notes (Signed)
[facility] Provider Note    Event Date/Time   First MD Initiated Contact with Patient 04/05/21 1400     (approximate)   History   Nasal Congestion and Cough   HPI  Sonya Wallace is a 61 y.o. female with a past medical history of tobacco abuse and recurrent bronchitis although never fully diagnosed with COPD or asthma who presents for assessment after 3 weeks of cough and congestion.  Patient states that when she is having a severe coughing fit she is short of breath otherwise is not short of breath at rest.  She does not have any associated chest pain, back pain, abdominal pain, nausea, vomiting, diarrhea.  Before throat.  She does feel congested and feels that there is infection in her sinuses as well.  No other acute concerns at this time.      Physical Exam  Triage Vital Signs: ED Triage Vitals  Enc Vitals Group     BP 04/05/21 1224 (!) 147/82     Pulse Rate 04/05/21 1224 88     Resp 04/05/21 1224 16     Temp 04/05/21 1224 97.8 F (36.6 C)     Temp Source 04/05/21 1224 Oral     SpO2 04/05/21 1224 97 %     Weight 04/05/21 1216 125 lb 10.6 oz (57 kg)     Height 04/05/21 1216 5\' 2"  (1.575 m)     Head Circumference --      Peak Flow --      Pain Score 04/05/21 1216 0     Pain Loc --      Pain Edu? --      Excl. in [place]? --     Most recent vital signs: Vitals:   04/05/21 1224  BP: (!) 147/82  Pulse: 88  Resp: 16  Temp: 97.8 F (36.6 C)  SpO2: 97%     General: Awake, no distress.  CV:  Good peripheral perfusion.  No murmurs rubs or gallops. Resp:  Normal effort.  No wheezing rhonchi or rales.  No evidence of respiratory distress Abd:  No distention.     ED Results / Procedures / Treatments  Labs (all labs ordered are listed, but only abnormal results are displayed) Labs Reviewed  RESP PANEL BY RT-PCR (FLU A&B, COVID) [id number]     EKG     RADIOLOGY  Chest x-ray was reviewed by myself and shows no focal consolidation,  effusion, edema, pneumothorax or other clear acute process.  I reviewed radiology interpretation and agree with her assessment as well.  PROCEDURES:  Critical Care performed: No  Procedures    MEDICATIONS ORDERED IN ED: Medications - No data to display   IMPRESSION / MDM / [place] / ED COURSE  I reviewed the triage vital signs and the nursing notes.                              Differential diagnosis includes, but is not limited to, COPD exacerbation, colitis, pneumonia, spontaneous pneumothorax, GERD and CHF.  Patient does not appear septic and has no shortness of breath at rest tachypnea hypoxia or other factors to suggest PE or ACS at this time.  Chest x-ray is unremarkable for evidence of bacterial pneumonia or an effusion or edema to suggest CHF.  In addition patient has no history of this and does not appear volume overloaded.  COVID influenza PCR is negative.  Suspect likely  acute viral URI.  Counseled patient on tobacco cessation.  She is fairly adamant that she receive a short course of antibiotics this is helped recurrent otitis in the past and given tobacco abuse history and symptoms ongoing for last 2 or 3 weeks I think this is reasonable.  We will also write for Rx for Tessalon Perles.  Emphasized importance of tobacco cessation and close outpatient PCP follow-up.  Discharged in stable condition.  Strict return precautions advised and discussed.        FINAL CLINICAL IMPRESSION(S) / ED DIAGNOSES   Final diagnoses:  Viral URI with cough  Tobacco abuse     Rx / DC Orders   ED Discharge Orders          Ordered    doxycycline (VIBRAMYCIN) 100 MG capsule  2 times daily        04/05/21 1433    benzonatate (TESSALON PERLES) 100 MG capsule  Every 6 hours PRN        04/05/21 1433             Note:  This document was prepared using Dragon voice recognition software and may include unintentional dictation errors.   Lucrezia Starch, MD 04/05/21  1434

## 2021-04-05 NOTE — ED Triage Notes (Signed)
Pt reports nasal congestion, productive cough with clear phlem, sinus pressure and pain and HA for several days. Pt reports her daughter was dx'd with URI a couple of days ago and thinks she caught it.

## 2022-11-13 IMAGING — CR DG CHEST 2V
2 series · 2 of 2 positions shown · non-contrast
Comparison: 07/17/2020

CLINICAL DATA: Cough.  Nasal congestion.

EXAM:
CHEST - 2 VIEW

[chest pa]
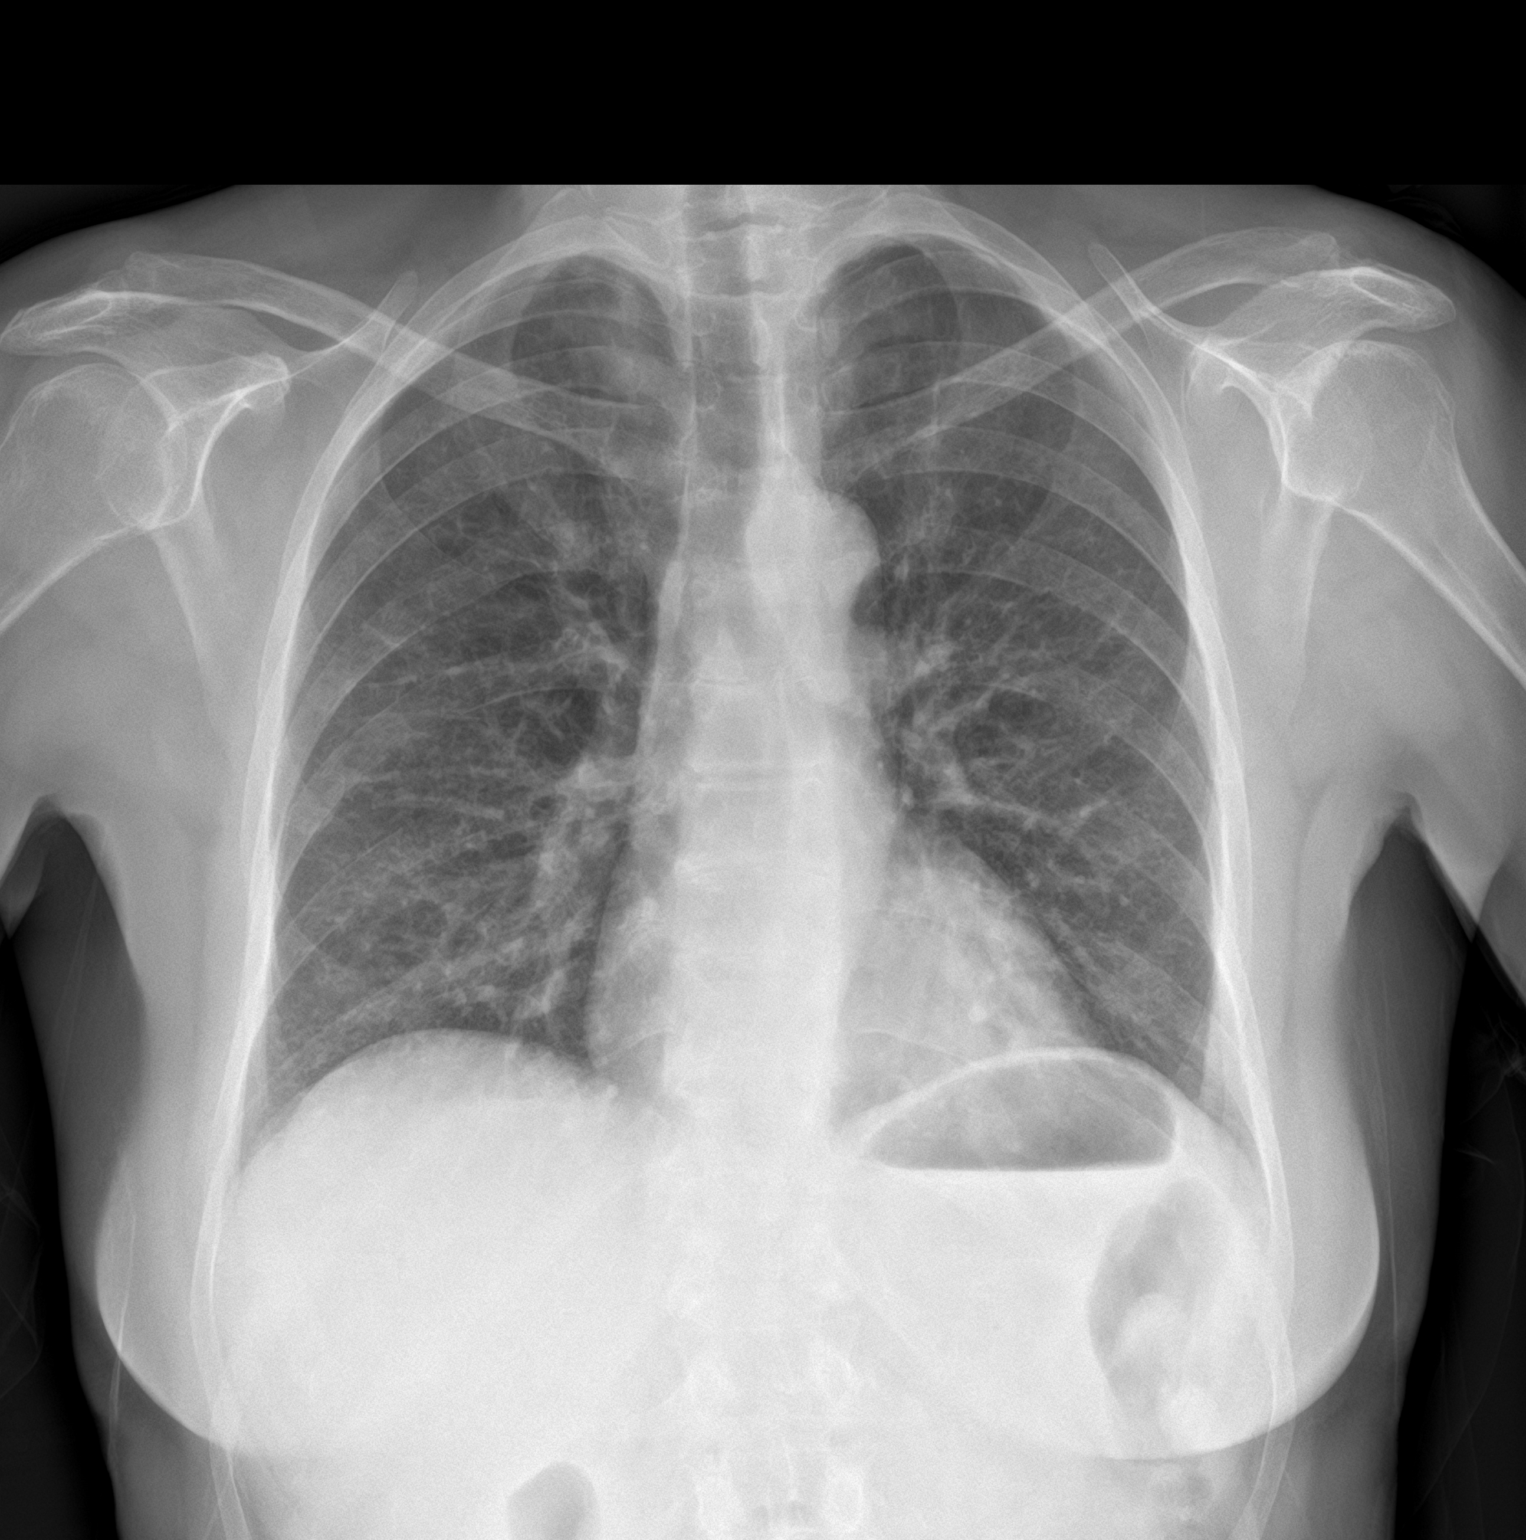

[chest lat]
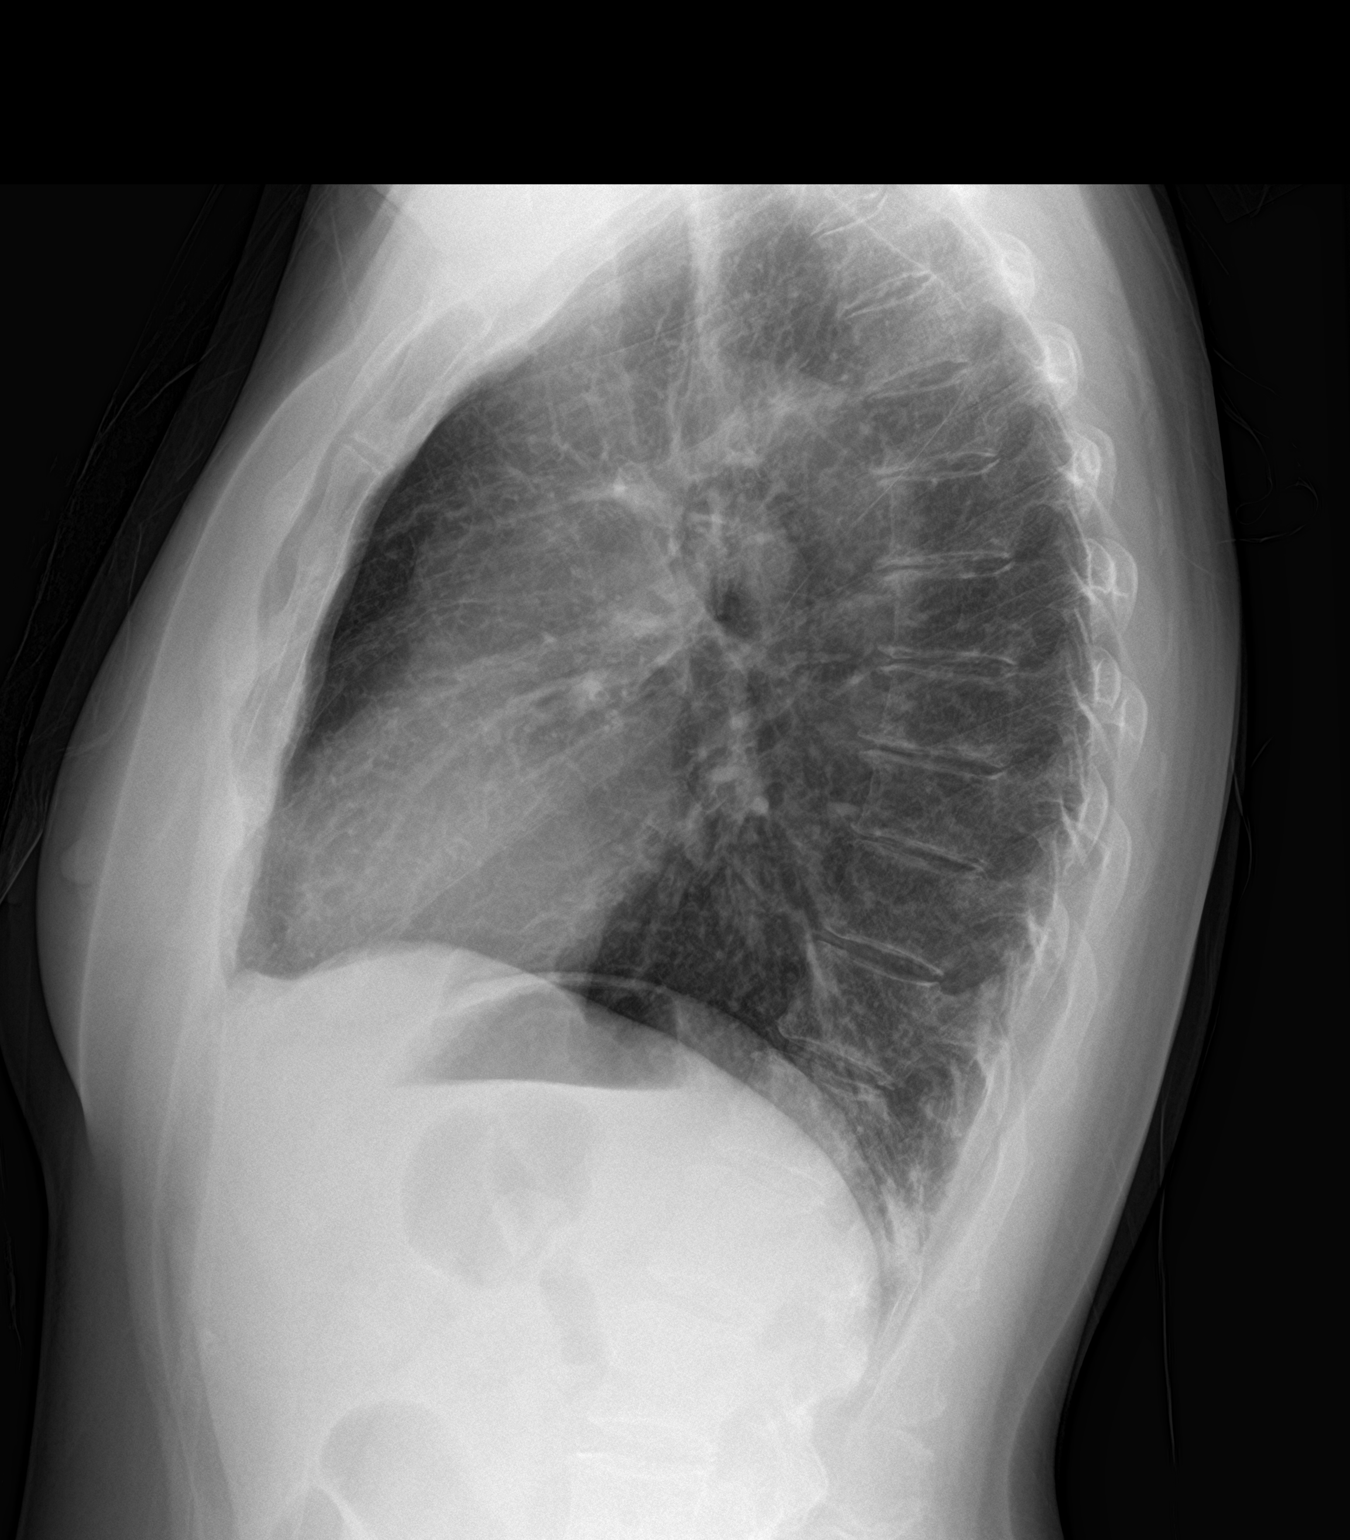

[2 of 2 positions shown; findings below may reference images not displayed]

FINDINGS: The heart size and mediastinal contours are within normal limits.
Both lungs are clear. The visualized skeletal structures are
unremarkable.
IMPRESSION: No active cardiopulmonary disease.

## 2022-11-22 ENCOUNTER — Emergency Department
Admission: EM | Admit: 2022-11-22 | Discharge: 2022-11-22 | Disposition: A | Discharge status: Home / Self Care | Payer: 59 | Attending: Emergency Medicine | Admitting: Emergency Medicine

## 2022-11-22 ENCOUNTER — Other Ambulatory Visit: Payer: Self-pay

## 2022-11-22 DIAGNOSIS — H5711 Ocular pain, right eye: Secondary | ICD-10-CM | POA: Diagnosis not present

## 2022-11-22 DIAGNOSIS — H1031 Unspecified acute conjunctivitis, right eye: Secondary | ICD-10-CM | POA: Insufficient documentation

## 2022-11-22 DIAGNOSIS — B9689 Other specified bacterial agents as the cause of diseases classified elsewhere: Secondary | ICD-10-CM | POA: Diagnosis not present

## 2022-11-22 MED ORDER — TOBRAMYCIN 0.3 % OP SOLN: 2.0000 [drp] | OPHTHALMIC | 0 refills | Status: DC

## 2022-11-22 MED ORDER — ERYTHROMYCIN 5 MG/GM OP OINT: TOPICAL_OINTMENT | OPHTHALMIC | 0 refills | Status: DC

## 2022-11-22 NOTE — ED Triage Notes (Signed)
Pt comes with c/o right eye pain for two days. Pt states  redness and some swelling.

## 2022-11-22 NOTE — ED Provider Notes (Signed)
[facility] Provider Note    Event Date/Time   First MD Initiated Contact with Patient 11/22/22 1330     (approximate)   History   Eye Pain   HPI  Sonya Wallace is a 62 y.o. female with no significant past medical history presents emergency department with right eye pain for 2 days.  States some redness and swelling.  Some drainage and matting.  States similar symptoms previously sleek few years ago and that the drops we gave her worked very well.  Denies fever, chills.  No change in vision.  No headache      Physical Exam   Triage Vital Signs: ED Triage Vitals  Encounter Vitals Group     BP 11/22/22 1253 (!) 141/93     Systolic BP Percentile --      Diastolic BP Percentile --      Pulse Rate 11/22/22 1253 96     Resp 11/22/22 1253 18     Temp 11/22/22 1317 98.1 F (36.7 C)     Temp Source 11/22/22 1317 Oral     SpO2 11/22/22 1253 99 %     Weight --      Height --      Head Circumference --      Peak Flow --      Pain Score 11/22/22 1251 4     Pain Loc --      Pain Education --      Exclude from Growth Chart --     Most recent vital signs: Vitals:   11/22/22 1253 11/22/22 1317  BP: (!) 141/93   Pulse: 96   Resp: 18   Temp:  98.1 F (36.7 C)  SpO2: 99%      General: Awake, no distress.   CV:  Good peripheral perfusion. regular rate and  rhythm Resp:  Normal effort. Lungs cta Abd:  No distention.   Other:  Right eye has injected conjunctiva, some matting noted on lashes, there is some small amount of periorbital cellulitis, EOMI, no entrapment noted   ED Results / Procedures / Treatments   Labs (all labs ordered are listed, but only abnormal results are displayed) Labs Reviewed - No data to display   EKG     RADIOLOGY     PROCEDURES:   Procedures   MEDICATIONS ORDERED IN ED: Medications - No data to display   IMPRESSION / MDM / ASSESSMENT AND PLAN / ED COURSE  I reviewed the triage vital signs and the  nursing notes.                              Differential diagnosis includes, but is not limited to, conjunctivitis, periorbital cellulitis, cellulitis, neuritis  Patient's presentation is most consistent with acute, uncomplicated illness.   Patient's exams consistent with acute conjunctivitis.  Concerns about the redness and swelling around the eye but I feel like it is mostly from the patient rubbing her eyes.  However we will place her on erythromycin ophthalmic ointment for the upper and lower lids along with tobramycin ophthalmic drops for conjunctivitis.  Patient is in agreement treatment plan.  She was instructed follow-up with [facility] if not improving in 3 to 4 days.  Return if worsening.  Discharged stable condition.      FINAL CLINICAL IMPRESSION(S) / ED DIAGNOSES   Final diagnoses:  Acute bacterial conjunctivitis of right eye     Rx /  DC Orders   ED Discharge Orders          Ordered    tobramycin (TOBREX) 0.3 % ophthalmic solution  Every 4 hours        11/22/22 1352    erythromycin ophthalmic ointment        11/22/22 1352             Note:  This document was prepared using Dragon voice recognition software and may include unintentional dictation errors.    Faythe Ghee, PA-C 11/22/22 1400    Jene Every, MD 11/22/22 [phone]

## 2023-10-07 ENCOUNTER — Emergency Department
Admission: EM | Admit: 2023-10-07 | Discharge: 2023-10-07 | Discharge status: Against Medical Advice | Payer: Self-pay | Attending: Emergency Medicine | Admitting: Emergency Medicine

## 2023-10-07 DIAGNOSIS — R42 Dizziness and giddiness: Secondary | ICD-10-CM | POA: Insufficient documentation

## 2023-10-07 DIAGNOSIS — Z5321 Procedure and treatment not carried out due to patient leaving prior to being seen by health care provider: Secondary | ICD-10-CM | POA: Insufficient documentation

## 2023-10-07 LAB — COMPREHENSIVE METABOLIC PANEL WITH GFR
ALT: 13 U/L (ref 0–44)
AST: 19 U/L (ref 15–41)
Albumin: 3.9 g/dL (ref 3.5–5.0)
Alkaline Phosphatase: 55 U/L (ref 38–126)
Anion gap: 10 (ref 5–15)
BUN: 10 mg/dL (ref 8–23)
CO2: 23 mmol/L (ref 22–32)
Calcium: 8.9 mg/dL (ref 8.9–10.3)
Chloride: 105 mmol/L (ref 98–111)
Creatinine, Ser: 0.6 mg/dL (ref 0.44–1.00)
GFR, Estimated: >60 mL/min (ref >60)
Glucose, Bld: 101 mg/dL — ABNORMAL HIGH (ref 70–99)
Potassium: 3.8 mmol/L (ref 3.5–5.1)
Sodium: 138 mmol/L (ref 135–145)
Total Bilirubin: 0.9 mg/dL (ref 0.0–1.2)
Total Protein: 6.6 g/dL (ref 6.5–8.1)

## 2023-10-07 LAB — CBC
HCT: 22.3 % — ABNORMAL LOW (ref 36.0–46.0)
Hemoglobin: 7.8 g/dL — ABNORMAL LOW (ref 12.0–15.0)
MCH: 44.1 pg — ABNORMAL HIGH (ref 26.0–34.0)
MCHC: 35 g/dL (ref 30.0–36.0)
MCV: 126 fL — ABNORMAL HIGH (ref 80.0–100.0)
Platelets: 264 K/uL (ref 150–400)
RBC: 1.77 MIL/uL — ABNORMAL LOW (ref 3.87–5.11)
RDW: 15.1 % (ref 11.5–15.5)
WBC: 2.8 K/uL — ABNORMAL LOW (ref 4.0–10.5)
nRBC: 0 % (ref 0.0–0.2)

## 2023-10-07 NOTE — ED Notes (Signed)
 Attempted patients numbers listed in chart with no answer

## 2023-10-07 NOTE — ED Notes (Signed)
 Attempted to call patients mobile with no answer

## 2023-10-07 NOTE — ED Triage Notes (Signed)
 Pt to ED from home with vertigo for the last week. Pt states she has a hx of vertigo and has not taken medication in several years. Denies CP/SOB.

## 2024-02-27 ENCOUNTER — Encounter: Payer: Self-pay | Admitting: Obstetrics and Gynecology

## 2024-02-27 ENCOUNTER — Inpatient Hospital Stay
Admission: EM | Admit: 2024-02-27 | Discharge: 2024-02-29 | DRG: 816 | Disposition: A | Discharge status: Home / Self Care | Payer: MEDICAID | Attending: Osteopathic Medicine | Admitting: Osteopathic Medicine

## 2024-02-27 ENCOUNTER — Inpatient Hospital Stay: Payer: MEDICAID

## 2024-02-27 ENCOUNTER — Other Ambulatory Visit: Payer: Self-pay

## 2024-02-27 ENCOUNTER — Emergency Department: Payer: MEDICAID

## 2024-02-27 DIAGNOSIS — K2970 Gastritis, unspecified, without bleeding: Secondary | ICD-10-CM | POA: Diagnosis present

## 2024-02-27 DIAGNOSIS — R112 Nausea with vomiting, unspecified: Secondary | ICD-10-CM | POA: Diagnosis present

## 2024-02-27 DIAGNOSIS — Z5982 Transportation insecurity: Secondary | ICD-10-CM

## 2024-02-27 DIAGNOSIS — Z79899 Other long term (current) drug therapy: Secondary | ICD-10-CM | POA: Diagnosis not present

## 2024-02-27 DIAGNOSIS — D539 Nutritional anemia, unspecified: Secondary | ICD-10-CM | POA: Diagnosis present

## 2024-02-27 DIAGNOSIS — E538 Deficiency of other specified B group vitamins: Secondary | ICD-10-CM | POA: Diagnosis present

## 2024-02-27 DIAGNOSIS — D72819 Decreased white blood cell count, unspecified: Secondary | ICD-10-CM | POA: Diagnosis present

## 2024-02-27 DIAGNOSIS — Z01812 Encounter for preprocedural laboratory examination: Secondary | ICD-10-CM

## 2024-02-27 DIAGNOSIS — F17200 Nicotine dependence, unspecified, uncomplicated: Secondary | ICD-10-CM | POA: Diagnosis present

## 2024-02-27 DIAGNOSIS — Z720 Tobacco use: Secondary | ICD-10-CM | POA: Insufficient documentation

## 2024-02-27 DIAGNOSIS — D649 Anemia, unspecified: Principal | ICD-10-CM | POA: Diagnosis present

## 2024-02-27 DIAGNOSIS — R918 Other nonspecific abnormal finding of lung field: Secondary | ICD-10-CM | POA: Diagnosis present

## 2024-02-27 DIAGNOSIS — D472 Monoclonal gammopathy: Secondary | ICD-10-CM | POA: Diagnosis present

## 2024-02-27 LAB — RETIC PANEL
Immature Retic Fract: 21.5 % — ABNORMAL HIGH (ref 2.3–15.9)
RBC.: 0.98 MIL/uL — ABNORMAL LOW (ref 3.87–5.11)
Retic Count, Absolute: 20.3 K/uL (ref 19.0–186.0)
Retic Ct Pct: 2.1 % (ref 0.4–3.1)
Reticulocyte Hemoglobin: 45.7 pg (ref >27.9)

## 2024-02-27 LAB — CBC
HCT: 13.3 % — CL (ref 36.0–46.0)
Hemoglobin: 4.6 g/dL — CL (ref 12.0–15.0)
MCH: 43.8 pg — ABNORMAL HIGH (ref 26.0–34.0)
MCHC: 34.6 g/dL (ref 30.0–36.0)
MCV: 126.7 fL — ABNORMAL HIGH (ref 80.0–100.0)
Platelets: 168 K/uL (ref 150–400)
RBC: 1.05 MIL/uL — ABNORMAL LOW (ref 3.87–5.11)
RDW: 19.6 % — ABNORMAL HIGH (ref 11.5–15.5)
WBC: 2 K/uL — ABNORMAL LOW (ref 4.0–10.5)
nRBC: 0 % (ref 0.0–0.2)

## 2024-02-27 LAB — COMPREHENSIVE METABOLIC PANEL WITH GFR
ALT: 15 U/L (ref 0–44)
AST: 38 U/L (ref 15–41)
Albumin: 4.4 g/dL (ref 3.5–5.0)
Alkaline Phosphatase: 42 U/L (ref 38–126)
Anion gap: 12 (ref 5–15)
BUN: 9 mg/dL (ref 8–23)
CO2: 23 mmol/L (ref 22–32)
Calcium: 9 mg/dL (ref 8.9–10.3)
Chloride: 102 mmol/L (ref 98–111)
Creatinine, Ser: 0.69 mg/dL (ref 0.44–1.00)
GFR, Estimated: >60 mL/min (ref >60)
Glucose, Bld: 131 mg/dL — ABNORMAL HIGH (ref 70–99)
Potassium: 5 mmol/L (ref 3.5–5.1)
Sodium: 136 mmol/L (ref 135–145)
Total Bilirubin: 1.7 mg/dL — ABNORMAL HIGH (ref 0.0–1.2)
Total Protein: 6.7 g/dL (ref 6.5–8.1)

## 2024-02-27 LAB — TECHNOLOGIST SMEAR REVIEW: Plt Morphology: NORMAL

## 2024-02-27 LAB — CBG MONITORING, ED: Glucose-Capillary: 115 mg/dL — ABNORMAL HIGH (ref 70–99)

## 2024-02-27 LAB — HEMOGLOBIN AND HEMATOCRIT, BLOOD
HCT: 12.4 % — CL (ref 36.0–46.0)
Hemoglobin: 4.4 g/dL — CL (ref 12.0–15.0)

## 2024-02-27 LAB — LIPASE, BLOOD: Lipase: 30 U/L (ref 11–51)

## 2024-02-27 LAB — BILIRUBIN, FRACTIONATED(TOT/DIR/INDIR)
Bilirubin, Direct: 0.7 mg/dL — ABNORMAL HIGH (ref 0.0–0.2)
Indirect Bilirubin: 1 mg/dL — ABNORMAL HIGH (ref 0.3–0.9)
Total Bilirubin: 1.7 mg/dL — ABNORMAL HIGH (ref 0.0–1.2)

## 2024-02-27 LAB — TSH: TSH: 22.5 u[IU]/mL — ABNORMAL HIGH (ref 0.350–4.500)

## 2024-02-27 LAB — ABO/RH: ABO/RH(D): O POS

## 2024-02-27 LAB — PREPARE RBC (CROSSMATCH)

## 2024-02-27 MED ORDER — IOHEXOL 300 MG/ML  SOLN
100.0000 mL | Freq: Once | INTRAMUSCULAR | Status: AC | PRN
  Administered 2024-02-27: 100 mL via INTRAVENOUS

## 2024-02-27 MED ORDER — SODIUM CHLORIDE 0.9 % IV SOLN: 10.0000 mL/h | Freq: Once | INTRAVENOUS | Status: DC

## 2024-02-27 NOTE — ED Notes (Signed)
 CCMD called to add patient to tele

## 2024-02-27 NOTE — ED Notes (Signed)
 CT called, 1st unit of PBRC'd completed at 2000, no reactions, VSS, will have CT scan prior to 2nd unit of PRBC's.

## 2024-02-27 NOTE — ED Triage Notes (Signed)
 Pt reports:  Dizziness Ongoing for the last month From sitting to standing Vomiting Last episode last night Happened when she doesn't eat Vomit white in color Abdomen pain  Happens after eating Heart Pounding After walking long distances Ears Feel Full I need my ears cleaned out

## 2024-02-27 NOTE — ED Provider Notes (Signed)
 [facility] Provider Note    Event Date/Time   First MD Initiated Contact with Patient 02/27/24 1546     (approximate)  History   Chief Complaint: Dizziness, Emesis, Abdominal Pain, Ear Fullness, and Palpitations  HPI  Sonya Wallace is a 63 y.o. female who presents to the emergency department for generalized weakness.  According to the patient for the past month or so she has been getting dizzy and weak feeling especially upon standing.  She has been nauseated at times did have an episode of vomiting last night.  Denies any black or bloody vomit.  Denies any black or bloody stool.  Patient states occasionally she will have some abdominal discomfort but denies any currently.  No alcohol use.  Patient states she has lost about 5 pounds over the last month or so unintentionally.  Is a daily smoker  Physical Exam   Triage Vital Signs: ED Triage Vitals  Encounter Vitals Group     BP 02/27/24 1359 (!) 109/55     Girls Systolic BP Percentile --      Girls Diastolic BP Percentile --      Boys Systolic BP Percentile --      Boys Diastolic BP Percentile --      Pulse Rate 02/27/24 1359 (!) 106     Resp 02/27/24 1359 18     Temp 02/27/24 1359 (!) 97.5 F (36.4 C)     Temp src --      SpO2 02/27/24 1359 100 %     Weight --      Height 02/27/24 1406 5' 2 (1.575 m)     Head Circumference --      Peak Flow --      Pain Score --      Pain Loc --      Pain Education --      Exclude from Growth Chart --     Most recent vital signs: Vitals:   02/27/24 1359  BP: (!) 109/55  Pulse: (!) 106  Resp: 18  Temp: (!) 97.5 F (36.4 C)  SpO2: 100%    General: Awake, no distress.  CV:  Good peripheral perfusion.  Regular rate and rhythm around 100 bpm Resp:  Normal effort.  Equal breath sounds bilaterally.  Abd:  No distention.  Soft, nontender.  No rebound or guarding.  Benign abdomen. Other:  Rectal exam shows light brown stool guaiac negative   ED Results /  Procedures / Treatments   EKG  EKG viewed and interpreted by myself shows a sinus rhythm at 95 bpm with a narrow QRS, normal axis, normal intervals, no concerning ST changes.  RADIOLOGY  I have reviewed interpret the chest x-ray images.  No obvious mass/consolidation seen on my evaluation. Radiology has read bronchitic changes without other abnormality   MEDICATIONS ORDERED IN ED: Medications - No data to display   IMPRESSION / MDM / ASSESSMENT AND PLAN / ED COURSE  I reviewed the triage vital signs and the nursing notes.  Patient's presentation is most consistent with acute presentation with potential threat to life or bodily function.  Patient presents emergency department for 1 month of dizziness/weakness symptoms shortness of breath with exertion.  Patient's lab work today shows significant anemia with a hemoglobin of 4.6.  Also shows leukopenia with a white blood cell count of 2.0 normal platelets of 168.  Patient's MCV is 126, patient does not drink alcohol.  Patient's chemistry is negative besides a slight bilirubin increase.  Patient's lipase is normal LFTs are normal.  We will repeat a CBC to ensure accuracy.  As long as the H&H is accurate patient will require blood transfusion.  I have already verbally consented the patient for blood products.  Patient's rectal exam is negative given the patient's leukopenia in addition to anemia she may require further workup by hematology.  In reviewing the patient's prior labs she had a hemoglobin of 7.8 4 months ago however prior to that it has been in the 14 range.  Repeat H&H confirms initial value.  Will transfuse 2 units of packed red blood cells.  We will admit to the hospital service for further workup and treatment likely will require hematology involvement as well.    CRITICAL CARE Performed by: Franky Moores   Total critical care time: 30 minutes  Critical care time was exclusive of separately billable procedures and  treating other patients.  Critical care was necessary to treat or prevent imminent or life-threatening deterioration.  Critical care was time spent personally by me on the following activities: development of treatment plan with patient and/or surrogate as well as nursing, discussions with consultants, evaluation of patient's response to treatment, examination of patient, obtaining history from patient or surrogate, ordering and performing treatments and interventions, ordering and review of laboratory studies, ordering and review of radiographic studies, pulse oximetry and re-evaluation of patient's condition.   FINAL CLINICAL IMPRESSION(S) / ED DIAGNOSES   Anemia Symptomatic anemia Leukopenia   Note:  This document was prepared using Dragon voice recognition software and may include unintentional dictation errors.   Moores Franky, MD 02/27/24 [phone]

## 2024-02-27 NOTE — H&P (Signed)
 History and Physical    Sonya Wallace FMW:[id number] DOB: Sep 13, 1960 DOA: 02/27/2024  PCP: Patient, No Pcp Per  Patient coming from: home   Chief Complaint: lightheadedness  HPI: Sonya Wallace is a 63 y.o. female with medical history significant for tobacco abuse presenting with the above.  Several months worsening fatigue and lightheadedness. No melena or other bleeding. Some abdominal discomfort and occasionally gets nauseaus and vomits when hungry. No chest pain or palpitations.    Review of Systems: As per HPI otherwise 10 point review of systems negative.    No past medical history on file.  No past surgical history on file.   reports that she has been smoking. She has never used smokeless tobacco. She reports that she does not drink alcohol and does not use drugs.  No Known Allergies  No family history on file.  Prior to Admission medications   Medication Sig Start Date End Date Taking? Authorizing Provider  albuterol  (PROVENTIL  HFA;VENTOLIN  HFA) 108 (90 Base) MCG/ACT inhaler Inhale 2 puffs into the lungs every 6 (six) hours as needed for wheezing or shortness of breath. 03/23/18   Alona Knee, PA-C  Blood Pressure Monitoring (BLOOD PRESSURE CUFF) MISC 1 Units by Does not apply route 3 (three) times daily. 03/23/18   Alona Knee, PA-C  erythromycin  ophthalmic ointment Apply to area on upper and lower lid 11/22/22   Fisher, Devere ORN, PA-C  predniSONE  (STERAPRED UNI-PAK 21 TAB) 10 MG (21) TBPK tablet Take 6 pills on day one then decrease by 1 pill each day 02/12/21   Gasper Devere ORN, PA-C  tobramycin  (TOBREX ) 0.3 % ophthalmic solution Place 2 drops into the right eye every 4 (four) hours. 11/22/22   Gasper Devere ORN, PA-C    Physical Exam: Vitals:   02/27/24 1359 02/27/24 1406 02/27/24 1812  BP: (!) 109/55  109/74  Pulse: (!) 106  98  Resp: 18  (!) 24  Temp: (!) 97.5 F (36.4 C)  98.7 F (37.1 C)  TempSrc:   Oral  SpO2: 100%  98%  Height:  5' 2 (1.575 m)      Constitutional: No acute distress Head: Atraumatic Eyes: Conjunctiva clear ENM: Moist mucous membranes. Normal dentition.  Neck: Supple Respiratory: Clear to auscultation bilaterally, no wheezing/rales/rhonchi. Normal respiratory effort. No accessory muscle use. . Cardiovascular: Regular rate and rhythm. No murmurs/rubs/gallops. Abdomen: Non-tender, non-distended. No masses. No rebound or guarding. Positive bowel sounds. Musculoskeletal: No joint deformity upper and lower extremities. Normal ROM, no contractures. Normal muscle tone.  Skin: No rashes, lesions, or ulcers. Mild jaundice? pale Extremities: No peripheral edema. Palpable peripheral pulses. Neurologic: Alert, moving all 4 extremities. Psychiatric: Normal insight and judgement.   Labs on Admission: I have personally reviewed following labs and imaging studies  CBC: Recent Labs  Lab 02/27/24 1407 02/27/24 1558  WBC 2.0*  --   HGB 4.6* 4.4*  HCT 13.3* 12.4*  MCV 126.7*  --   PLT 168  --    Basic Metabolic Panel: Recent Labs  Lab 02/27/24 1407  NA 136  K 5.0  CL 102  CO2 23  GLUCOSE 131*  BUN 9  CREATININE 0.69  CALCIUM 9.0   GFR: CrCl cannot be calculated (Unknown ideal weight.). Liver Function Tests: Recent Labs  Lab 02/27/24 1407  AST 38  ALT 15  ALKPHOS 42  BILITOT 1.7*  PROT 6.7  ALBUMIN 4.4   Recent Labs  Lab 02/27/24 1407  LIPASE 30   No results for input(s): AMMONIA in the last  168 hours. Coagulation Profile: No results for input(s): INR, PROTIME in the last 168 hours. Cardiac Enzymes: No results for input(s): CKTOTAL, CKMB, CKMBINDEX, TROPONINI in the last 168 hours. BNP (last 3 results) No results for input(s): PROBNP in the last 8760 hours. HbA1C: No results for input(s): HGBA1C in the last 72 hours. CBG: Recent Labs  Lab 02/27/24 1410  GLUCAP 115*   Lipid Profile: No results for input(s): CHOL, HDL, LDLCALC, TRIG, CHOLHDL, LDLDIRECT in the  last 72 hours. Thyroid Function Tests: No results for input(s): TSH, T4TOTAL, FREET4, T3FREE, THYROIDAB in the last 72 hours. Anemia Panel: No results for input(s): VITAMINB12, FOLATE, FERRITIN, TIBC, IRON, RETICCTPCT in the last 72 hours. Urine analysis:    Component Value Date/Time   COLORURINE YELLOW (A) 02/25/2020 1040   APPEARANCEUR CLOUDY (A) 02/25/2020 1040   LABSPEC 1.006 02/25/2020 1040   PHURINE 5.0 02/25/2020 1040   GLUCOSEU NEGATIVE 02/25/2020 1040   HGBUR SMALL (A) 02/25/2020 1040   BILIRUBINUR NEGATIVE 02/25/2020 1040   KETONESUR NEGATIVE 02/25/2020 1040   PROTEINUR NEGATIVE 02/25/2020 1040   NITRITE NEGATIVE 02/25/2020 1040   LEUKOCYTESUR MODERATE (A) 02/25/2020 1040    Radiological Exams on Admission: DG Chest Portable 1 View Result Date: 02/27/2024 EXAM: 1 VIEW(S) XRAY OF THE CHEST 02/27/2024 04:33:00 PM COMPARISON: None available. CLINICAL HISTORY: smoker with weight loss, eval for mass FINDINGS: LUNGS AND PLEURA: Peribronchial thickening and perihilar interstitial changes in the lungs. Bronchitic changes in the lungs. No pleural effusion. No pneumothorax. HEART AND MEDIASTINUM: No acute abnormality of the cardiac and mediastinal silhouettes. BONES AND SOFT TISSUES: No acute osseous abnormality. IMPRESSION: 1. Bronchitic changes in the lungs. No focal mass or consolidation. Electronically signed by: Elsie Gravely MD 02/27/2024 05:20 PM EST RP Workstation: [id number]     Assessment/Plan Principal Problem:   Symptomatic anemia    # Symptomatic anemia # Macrocytic anemia # Leukopenia Normal hgb 4 years ago, was 7.8 in July, now in the 4s. Mcv markedly elevated, wbcs low. - 2 units ordered - will discuss with hematology. Aplastic anemia, leukemia, etc. On ddx - f/u folate, b12, smear, tsh  # Tobacco abuse Declines patch  DVT prophylaxis: SCDs Code Status: full  Family Communication: none at bedside  Consults called: hematology    Level of care: Med-Surg Status is: Inpatient Remains inpatient appropriate because: severity of illness    Devaughn KATHEE Ban MD [facility] Pager [phone]  If 7PM-7AM, please contact night-coverage [email or web address] Password [email or web address]  02/27/2024, 6:15 PM

## 2024-02-28 ENCOUNTER — Other Ambulatory Visit: Payer: Self-pay

## 2024-02-28 DIAGNOSIS — Z720 Tobacco use: Secondary | ICD-10-CM

## 2024-02-28 DIAGNOSIS — E538 Deficiency of other specified B group vitamins: Secondary | ICD-10-CM

## 2024-02-28 DIAGNOSIS — Z01812 Encounter for preprocedural laboratory examination: Secondary | ICD-10-CM

## 2024-02-28 LAB — CBC WITH DIFFERENTIAL/PLATELET
Abs Immature Granulocytes: 0.02 K/uL (ref 0.00–0.07)
Basophils Absolute: 0 K/uL (ref 0.0–0.1)
Basophils Relative: 0 %
Eosinophils Absolute: 0.1 K/uL (ref 0.0–0.5)
Eosinophils Relative: 2 %
HCT: 27.4 % — ABNORMAL LOW (ref 36.0–46.0)
Hemoglobin: 9.5 g/dL — ABNORMAL LOW (ref 12.0–15.0)
Immature Granulocytes: 1 %
Lymphocytes Relative: 40 %
Lymphs Abs: 1.1 K/uL (ref 0.7–4.0)
MCH: 34.7 pg — ABNORMAL HIGH (ref 26.0–34.0)
MCHC: 34.7 g/dL (ref 30.0–36.0)
MCV: 100 fL (ref 80.0–100.0)
Monocytes Absolute: 0.1 K/uL (ref 0.1–1.0)
Monocytes Relative: 3 %
Neutro Abs: 1.5 K/uL — ABNORMAL LOW (ref 1.7–7.7)
Neutrophils Relative %: 54 %
Platelets: 142 K/uL — ABNORMAL LOW (ref 150–400)
RBC: 2.74 MIL/uL — ABNORMAL LOW (ref 3.87–5.11)
RDW: 24.8 % — ABNORMAL HIGH (ref 11.5–15.5)
Smear Review: NORMAL
WBC: 2.8 K/uL — ABNORMAL LOW (ref 4.0–10.5)
nRBC: 0 % (ref 0.0–0.2)

## 2024-02-28 LAB — CBC
HCT: 25.4 % — ABNORMAL LOW (ref 36.0–46.0)
Hemoglobin: 8.7 g/dL — ABNORMAL LOW (ref 12.0–15.0)
MCH: 34.5 pg — ABNORMAL HIGH (ref 26.0–34.0)
MCHC: 34.3 g/dL (ref 30.0–36.0)
MCV: 100.8 fL — ABNORMAL HIGH (ref 80.0–100.0)
Platelets: 124 K/uL — ABNORMAL LOW (ref 150–400)
RBC: 2.52 MIL/uL — ABNORMAL LOW (ref 3.87–5.11)
RDW: 24.7 % — ABNORMAL HIGH (ref 11.5–15.5)
WBC: 2.2 K/uL — ABNORMAL LOW (ref 4.0–10.5)
nRBC: 0 % (ref 0.0–0.2)

## 2024-02-28 LAB — COMPREHENSIVE METABOLIC PANEL WITH GFR
ALT: 14 U/L (ref 0–44)
AST: 33 U/L (ref 15–41)
Albumin: 4.2 g/dL (ref 3.5–5.0)
Alkaline Phosphatase: 42 U/L (ref 38–126)
Anion gap: 9 (ref 5–15)
BUN: 9 mg/dL (ref 8–23)
CO2: 26 mmol/L (ref 22–32)
Calcium: 8.9 mg/dL (ref 8.9–10.3)
Chloride: 102 mmol/L (ref 98–111)
Creatinine, Ser: 0.74 mg/dL (ref 0.44–1.00)
GFR, Estimated: >60 mL/min (ref >60)
Glucose, Bld: 92 mg/dL (ref 70–99)
Potassium: 4.2 mmol/L (ref 3.5–5.1)
Sodium: 136 mmol/L (ref 135–145)
Total Bilirubin: 1.3 mg/dL — ABNORMAL HIGH (ref 0.0–1.2)
Total Protein: 6.3 g/dL — ABNORMAL LOW (ref 6.5–8.1)

## 2024-02-28 LAB — BPAM RBC
Blood Product Expiration Date: 202512282359
Blood Product Expiration Date: 202512282359
ISSUE DATE / TIME: 202511241809
ISSUE DATE / TIME: 202511242056
Unit Type and Rh: 5100
Unit Type and Rh: 5100

## 2024-02-28 LAB — TYPE AND SCREEN
ABO/RH(D): O POS
Antibody Screen: NEGATIVE
Unit division: 0
Unit division: 0

## 2024-02-28 LAB — URINALYSIS, ROUTINE W REFLEX MICROSCOPIC
Bilirubin Urine: NEGATIVE
Glucose, UA: NEGATIVE mg/dL
Hgb urine dipstick: NEGATIVE
Ketones, ur: NEGATIVE mg/dL
Leukocytes,Ua: NEGATIVE
Nitrite: NEGATIVE
Protein, ur: NEGATIVE mg/dL
Specific Gravity, Urine: >1.046 — ABNORMAL HIGH (ref 1.005–1.030)
pH: 6 (ref 5.0–8.0)

## 2024-02-28 LAB — HIV ANTIBODY (ROUTINE TESTING W REFLEX): HIV Screen 4th Generation wRfx: NONREACTIVE

## 2024-02-28 LAB — VITAMIN B12: Vitamin B-12: <150 pg/mL — ABNORMAL LOW (ref 180–914)

## 2024-02-28 LAB — PROTIME-INR
INR: 1 (ref 0.8–1.2)
Prothrombin Time: 14.2 s (ref 11.4–15.2)

## 2024-02-28 LAB — LACTATE DEHYDROGENASE: LDH: 1845 U/L — ABNORMAL HIGH (ref 105–235)

## 2024-02-28 LAB — FOLATE: Folate: 11.1 ng/mL (ref >5.9)

## 2024-02-28 LAB — T4, FREE: Free T4: 0.5 ng/dL — ABNORMAL LOW (ref 0.61–1.12)

## 2024-02-28 MED ORDER — PANTOPRAZOLE SODIUM 40 MG IV SOLR
40.0000 mg | Freq: Two times a day (BID) | INTRAVENOUS | Status: DC
  Administered 2024-02-28 – 2024-02-29 (×3): 40 mg via INTRAVENOUS
  Filled 2024-02-28 (×3): qty 10

## 2024-02-28 MED ORDER — CYANOCOBALAMIN 1000 MCG/ML IJ SOLN
1000.0000 ug | Freq: Every day | INTRAMUSCULAR | Status: DC
  Administered 2024-02-28 – 2024-02-29 (×2): 1000 ug via INTRAMUSCULAR
  Filled 2024-02-28 (×2): qty 1

## 2024-02-28 NOTE — ED Notes (Signed)
 Pt eager to walk around the halls to see how she feels. I informed her that it would not be safe to do so due to her WBC results and risk of her getting an infection or exposed to other germs. Pt expressed understanding for same.

## 2024-02-28 NOTE — Consult Note (Signed)
 Hematology/Oncology Consult note [phone] [phone]      Patient Care Team: Patient, No Pcp Per as PCP - General (General Practice)   Name of the patient: Sonya Wallace  [id number]  05/31/1960   REASON FOR COSULTATION:   History of presenting illness-  63 y.o. female with PMH listed at below who presents to ER      No Known Allergies  Patient Active Problem List   Diagnosis Date Noted   Symptomatic anemia 02/27/2024   Tobacco abuse 02/27/2024     History reviewed. No pertinent past medical history.   History reviewed. No pertinent surgical history.  Social History   Socioeconomic History   Marital status: Married    Spouse name: Not on file   Number of children: Not on file   Years of education: Not on file   Highest education level: Not on file  Occupational History   Not on file  Tobacco Use   Smoking status: Every Day   Smokeless tobacco: Never  Substance and Sexual Activity   Alcohol use: No   Drug use: No   Sexual activity: Not on file  Other Topics Concern   Not on file  Social History Narrative   Not on file   Social Drivers of Health   Financial Resource Strain: Not on file  Food Insecurity: Not on file  Transportation Needs: Not on file  Physical Activity: Not on file  Stress: Not on file  Social Connections: Not on file  Intimate Partner Violence: Not on file     History reviewed. No pertinent family history.   Current Facility-Administered Medications:    0.9 %  sodium chloride  infusion, 10 mL/hr, Intravenous, Once, Wouk, Devaughn Sayres, MD, Held at 02/27/24 1623   pantoprazole  (PROTONIX ) injection 40 mg, 40 mg, Intravenous, Q12H, Wouk, Devaughn Sayres, MD, 40 mg at 02/28/24 0945  Current Outpatient Medications:    albuterol  (PROVENTIL  HFA;VENTOLIN  HFA) 108 (90 Base) MCG/ACT inhaler, Inhale 2 puffs into the lungs every 6 (six) hours as needed for wheezing or shortness of breath. (Patient not taking: Reported  on 02/27/2024), Disp: 1 Inhaler, Rfl: 0   Blood Pressure Monitoring (BLOOD PRESSURE CUFF) MISC, 1 Units by Does not apply route 3 (three) times daily., Disp: 1 each, Rfl: 0   erythromycin  ophthalmic ointment, Apply to area on upper and lower lid (Patient not taking: Reported on 02/27/2024), Disp: 3.5 g, Rfl: 0   meclizine (ANTIVERT) 25 MG tablet, Take 25 mg by mouth 3 (three) times daily as needed. (Patient not taking: Reported on 02/27/2024), Disp: , Rfl:    predniSONE  (STERAPRED UNI-PAK 21 TAB) 10 MG (21) TBPK tablet, Take 6 pills on day one then decrease by 1 pill each day (Patient not taking: Reported on 02/27/2024), Disp: 21 tablet, Rfl: 0   tobramycin  (TOBREX ) 0.3 % ophthalmic solution, Place 2 drops into the right eye every 4 (four) hours. (Patient not taking: Reported on 02/27/2024), Disp: 5 mL, Rfl: 0  Review of Systems - Oncology  PHYSICAL EXAM Vitals:   02/28/24 0059 02/28/24 0451 02/28/24 0829 02/28/24 0830  BP: (!) 140/69 (!) 147/78 (!) 148/67 (!) 145/77  Pulse: 74 75 72 70  Resp: 16 16 15 13   Temp:  97.8 F (36.6 C) 97.7 F (36.5 C)   TempSrc:  Oral Oral   SpO2: 98% 98% 99% 100%  Weight:      Height:       Physical Exam    LABORATORY STUDIES  Latest Ref Rng & Units 02/28/2024    4:34 AM 02/28/2024   12:36 AM 02/27/2024    3:58 PM  CBC  WBC 4.0 - 10.5 K/uL 2.2  2.8    Hemoglobin 12.0 - 15.0 g/dL 8.7  9.5  4.4   Hematocrit 36.0 - 46.0 % 25.4  27.4  12.4   Platelets 150 - 400 K/uL 124  142        Latest Ref Rng & Units 02/28/2024    4:34 AM 02/27/2024    3:50 PM 02/27/2024    2:07 PM  CMP  Glucose 70 - 99 mg/dL 92   868   BUN 8 - 23 mg/dL 9   9   Creatinine 9.55 - 1.00 mg/dL 9.25   9.30   Sodium 864 - 145 mmol/L 136   136   Potassium 3.5 - 5.1 mmol/L 4.2   5.0   Chloride 98 - 111 mmol/L 102   102   CO2 22 - 32 mmol/L 26   23   Calcium 8.9 - 10.3 mg/dL 8.9   9.0   Total Protein 6.5 - 8.1 g/dL 6.3   6.7   Total Bilirubin 0.0 - 1.2 mg/dL 1.3  1.7  1.7    Alkaline Phos 38 - 126 U/L 42   42   AST 15 - 41 U/L 33   38   ALT 0 - 44 U/L 14   15      RADIOGRAPHIC STUDIES: I have personally reviewed the radiological images as listed and agreed with the findings in the report. CT CHEST ABDOMEN PELVIS W CONTRAST Result Date: 02/27/2024 EXAM: CT CHEST, ABDOMEN AND PELVIS WITH CONTRAST 02/27/2024 08:40:53 PM TECHNIQUE: CT of the chest, abdomen and pelvis was performed with the administration of 100 mL of iohexol  (OMNIPAQUE ) 300 MG/ML solution. Multiplanar reformatted images are provided for review. Automated exposure control, iterative reconstruction, and/or weight based adjustment of the mA/kV was utilized to reduce the radiation dose to as low as reasonably achievable. COMPARISON: None available. CLINICAL HISTORY: Abdominal pain, vomiting, anemia, several months of worsening fatigue and lightheadedness. FINDINGS: CHEST: MEDIASTINUM AND LYMPH NODES: Heart and pericardium are unremarkable. No visible coronary artery calcification and no pericardial effusion. The central airways are clear. The thoracic aorta is slightly tortuous with minimal calcific plaques of the descending segment and proximal right subclavian artery. The great vessels and the aorta are otherwise free of visible plaques. There is no aortic or great vessel aneurysm, stenosis or dissection. No mediastinal, hilar or axillary lymphadenopathy. LUNGS AND PLEURA: There is bilateral apical linear and reticular scarring change. Mild diffuse bronchial thickening. Mild to moderate emphysematous disease is seen with centrilobular changes predominating. There is a 3 mm right upper lobe apical nodule on series 4 axial 32, 3 mm posterior right apical nodule on image 33, 3 mm nodule in the anterior right upper lobe base on image 68, 4 mm right middle lobe nodule on image 75 and a 6 mm subpleural right lower lobe lateral basal nodule on image 94. No nodules in the left lung. There is no consolidation or effusion  bilaterally. No pneumothorax. ABDOMEN AND PELVIS: LIVER: The liver is unremarkable. GALLBLADDER AND BILE DUCTS: Gallbladder is unremarkable. No biliary ductal dilatation. SPLEEN: No acute abnormality. PANCREAS: No acute abnormality. ADRENAL GLANDS: No acute abnormality. There is no adrenal mass. KIDNEYS, URETERS AND BLADDER: No stones in the kidneys or ureters. No hydronephrosis. No perinephric or periureteral stranding. No renal mass enhancement. Urinary bladder is unremarkable.  GI AND BOWEL: There is irregular mucosal fold thickening and enhancement in the antropyloric portion of the stomach consistent with gastritis or peptic ulcer disease. Endoscopy may be indicated given the clinical history of anemia. The stomach is distended with fluid and food products. The small bowel is normal in caliber without inflammatory change. The appendix is normal. There is either mild wall thickening or changes of underdistension in the mid to distal sigmoid colon. Correlate clinically for underlying colitis. There is no bowel obstruction. REPRODUCTIVE ORGANS: There is left greater than right gonadal vein engorgement and pelvic venous congestion. The uterus and ovaries are not enlarged. PERITONEUM AND RETROPERITONEUM: No ascites. No free air. VASCULATURE: Aorta is normal in caliber. There is mild aortic atherosclerosis. No abdominal aortic aneurysm is seen. ABDOMINAL AND PELVIS LYMPH NODES: No lymphadenopathy. BONES AND SOFT TISSUES: There is thoracic spondylosis with no acute osseous findings. Multilevel thoracic degenerative disc disease. There is advanced degenerative disc disease at L5-S1 with vacuum phenomenon. No acute or significant osseous findings are seen elsewhere. No focal soft tissue abnormality. IMPRESSION: 1. Irregular mucosal fold thickening and enhancement in the antropyloric stomach consistent with gastritis or peptic ulcer disease; endoscopy is recommended given anemia and symptoms. 2. Findings in the mid to  distal sigmoid colon may reflect mild wall thickening versus underdistension; clinical correlation recommended for underlying colitis. 3. Multiple small right lung nodules, largest 6 mm in the right lower lobe subpleural region; recommend non-contrast chest CT at 612 months, then consider 1824 months follow-up per Fleischner Society guidelines given unknown risk. 4. Mild to moderate emphysema with centrilobular predominance; consider evaluation for eligibility for a low-dose CT lung cancer screening program. 5. 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 6. Aortic atherosclerosis . Electronically signed by: Francis Quam MD 02/27/2024 09:12 PM EST RP Workstation: [id number]   DG Chest Portable 1 View Result Date: 02/27/2024 EXAM: 1 VIEW(S) XRAY OF THE CHEST 02/27/2024 04:33:00 PM COMPARISON: None available. CLINICAL HISTORY: smoker with weight loss, eval for mass FINDINGS: LUNGS AND PLEURA: Peribronchial thickening and perihilar interstitial changes in the lungs. Bronchitic changes in the lungs. No pleural effusion. No pneumothorax. HEART AND MEDIASTINUM: No acute abnormality of the cardiac and mediastinal silhouettes. BONES AND SOFT TISSUES: No acute osseous abnormality. IMPRESSION: 1. Bronchitic changes in the lungs. No focal mass or consolidation. Electronically signed by: Elsie Gravely MD 02/27/2024 05:20 PM EST RP Workstation: [id number]     Assessment and plan-    Thank you for allowing me to participate in the care of this patient.   Zelphia Cap, MD, PhD Hematology Oncology 02/28/2024

## 2024-02-28 NOTE — Consult Note (Signed)
 Hematology/Oncology Consult note [phone] [phone]      Patient Care Team: Patient, No Pcp Per as PCP - General (General Practice)   Name of the patient: Sonya Wallace  [id number]  10/18/1960   REASON FOR COSULTATION:  Severe anemia and leukopenia. History of presenting illness-  63 y.o. female with PMH listed at below who presents to ER for evaluation of progressive/weakness and lightheadedness.  Denies any melena or rectal bleeding. She endorses epigastric discomfort occasionally. In emergency room, she was found to have a hemoglobin of 4.7.  MCV 126.  WBC 2 CBC done on 10/07/2023 showed hemoglobin of 7.8.  With WBC of 2.8.  Patient reports a history of thyroid disease.  Currently not on thyroid medication.  TSH was found to be elevated 22, with decreased T4.  02/27/2024, CT scan showed 1. Irregular mucosal fold thickening and enhancement in the antropyloric stomach consistent with gastritis or peptic ulcer disease; endoscopy is recommended given anemia and symptoms. 2. Findings in the mid to distal sigmoid colon may reflect mild wall thickening versus underdistension; clinical correlation recommended for underlying colitis. 3. Multiple small right lung nodules, largest 6 mm in the right lower lobe subpleural region; recommend non-contrast chest CT at 612 months, then consider 1824 months follow-up per Fleischner Society guidelines given unknown risk. 4. Mild to moderate emphysema with centrilobular predominance; consider evaluation for eligibility for a low-dose CT lung cancer screening program. 5. 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 6. Aortic atherosclerosis .  Patient was transfused with PRBCs and admitted for further evaluation.  Today hemoglobin has improved appropriately to 9.5.  White count 2.8.  She feels fatigue has improved since the PRBC transfusion.    No Known Allergies  Patient Active  Problem List   Diagnosis Date Noted   Symptomatic anemia 02/27/2024   Tobacco abuse 02/27/2024     History reviewed. No pertinent past medical history.   History reviewed. No pertinent surgical history.  Social History   Socioeconomic History   Marital status: Married    Spouse name: Not on file   Number of children: Not on file   Years of education: Not on file   Highest education level: Not on file  Occupational History   Not on file  Tobacco Use   Smoking status: Every Day   Smokeless tobacco: Never  Substance and Sexual Activity   Alcohol use: No   Drug use: No   Sexual activity: Not on file  Other Topics Concern   Not on file  Social History Narrative   Not on file   Social Drivers of Health   Financial Resource Strain: Not on file  Food Insecurity: No Food Insecurity (02/28/2024)   Hunger Vital Sign    Worried About Running Out of Food in the Last Year: Never true    Ran Out of Food in the Last Year: Never true  Transportation Needs: Unmet Transportation Needs (02/28/2024)   PRAPARE - Administrator, Civil Service (Medical): No    Lack of Transportation (Non-Medical): Yes  Physical Activity: Not on file  Stress: Not on file  Social Connections: Moderately Isolated (02/28/2024)   Social Connection and Isolation Panel    Frequency of Communication with Friends and Family: More than three times a week    Frequency of Social Gatherings with Friends and Family: More than three times a week    Attends Religious Services: More than 4 times per year  Active Member of Clubs or Organizations: No    Attends Banker Meetings: Never    Marital Status: Separated  Intimate Partner Violence: Not At Risk (02/28/2024)   Humiliation, Afraid, Rape, and Kick questionnaire    Fear of Current or Ex-Partner: No    Emotionally Abused: No    Physically Abused: No    Sexually Abused: No     History reviewed. No pertinent family history.   Current  Facility-Administered Medications:    0.9 %  sodium chloride  infusion, 10 mL/hr, Intravenous, Once, Wouk, Devaughn Sayres, MD, Held at 02/27/24 1623   pantoprazole  (PROTONIX ) injection 40 mg, 40 mg, Intravenous, Q12H, Wouk, Devaughn Sayres, MD, 40 mg at 02/28/24 0945  Review of Systems  Constitutional:  Positive for appetite change and fatigue. Negative for chills and fever.  HENT:   Negative for hearing loss and voice change.   Eyes:  Negative for eye problems.  Respiratory:  Negative for chest tightness and cough.   Cardiovascular:  Negative for chest pain.  Gastrointestinal:  Positive for abdominal pain. Negative for abdominal distention and blood in stool.  Endocrine: Negative for hot flashes.  Genitourinary:  Negative for difficulty urinating and frequency.   Musculoskeletal:  Negative for arthralgias.  Skin:  Negative for itching and rash.  Neurological:  Negative for extremity weakness.  Hematological:  Negative for adenopathy.  Psychiatric/Behavioral:  Negative for confusion.     PHYSICAL EXAM Vitals:   02/28/24 0829 02/28/24 0830 02/28/24 1506 02/28/24 1632  BP: (!) 148/67 (!) 145/77  (!) 151/72  Pulse: 72 70  79  Resp: 15 13  19   Temp: 97.7 F (36.5 C)   97.8 F (36.6 C)  TempSrc: Oral   Oral  SpO2: 99% 100% 100% 99%  Weight:      Height:       Physical Exam Constitutional:      General: She is not in acute distress.    Appearance: She is not diaphoretic.  HENT:     Head: Normocephalic and atraumatic.  Eyes:     General: No scleral icterus. Cardiovascular:     Rate and Rhythm: Normal rate and regular rhythm.  Pulmonary:     Effort: Pulmonary effort is normal. No respiratory distress.     Breath sounds: Normal breath sounds. No wheezing.  Abdominal:     General: There is no distension.     Palpations: Abdomen is soft.     Tenderness: There is no abdominal tenderness.  Musculoskeletal:        General: Normal range of motion.     Cervical back: Normal range of  motion and neck supple.  Skin:    General: Skin is warm and dry.     Findings: No erythema.  Neurological:     Mental Status: She is alert and oriented to person, place, and time. Mental status is at baseline.     Motor: No abnormal muscle tone.  Psychiatric:        Mood and Affect: Mood and affect normal.       LABORATORY STUDIES    Latest Ref Rng & Units 02/28/2024    4:34 AM 02/28/2024   12:36 AM 02/27/2024    3:58 PM  CBC  WBC 4.0 - 10.5 K/uL 2.2  2.8    Hemoglobin 12.0 - 15.0 g/dL 8.7  9.5  4.4   Hematocrit 36.0 - 46.0 % 25.4  27.4  12.4   Platelets 150 - 400 K/uL 124  142  Latest Ref Rng & Units 02/28/2024    4:34 AM 02/27/2024    3:50 PM 02/27/2024    2:07 PM  CMP  Glucose 70 - 99 mg/dL 92   868   BUN 8 - 23 mg/dL 9   9   Creatinine 9.55 - 1.00 mg/dL 9.25   9.30   Sodium 864 - 145 mmol/L 136   136   Potassium 3.5 - 5.1 mmol/L 4.2   5.0   Chloride 98 - 111 mmol/L 102   102   CO2 22 - 32 mmol/L 26   23   Calcium 8.9 - 10.3 mg/dL 8.9   9.0   Total Protein 6.5 - 8.1 g/dL 6.3   6.7   Total Bilirubin 0.0 - 1.2 mg/dL 1.3  1.7  1.7   Alkaline Phos 38 - 126 U/L 42   42   AST 15 - 41 U/L 33   38   ALT 0 - 44 U/L 14   15      RADIOGRAPHIC STUDIES: I have personally reviewed the radiological images as listed and agreed with the findings in the report. CT CHEST ABDOMEN PELVIS W CONTRAST Result Date: 02/27/2024 EXAM: CT CHEST, ABDOMEN AND PELVIS WITH CONTRAST 02/27/2024 08:40:53 PM TECHNIQUE: CT of the chest, abdomen and pelvis was performed with the administration of 100 mL of iohexol  (OMNIPAQUE ) 300 MG/ML solution. Multiplanar reformatted images are provided for review. Automated exposure control, iterative reconstruction, and/or weight based adjustment of the mA/kV was utilized to reduce the radiation dose to as low as reasonably achievable. COMPARISON: None available. CLINICAL HISTORY: Abdominal pain, vomiting, anemia, several months of worsening fatigue and  lightheadedness. FINDINGS: CHEST: MEDIASTINUM AND LYMPH NODES: Heart and pericardium are unremarkable. No visible coronary artery calcification and no pericardial effusion. The central airways are clear. The thoracic aorta is slightly tortuous with minimal calcific plaques of the descending segment and proximal right subclavian artery. The great vessels and the aorta are otherwise free of visible plaques. There is no aortic or great vessel aneurysm, stenosis or dissection. No mediastinal, hilar or axillary lymphadenopathy. LUNGS AND PLEURA: There is bilateral apical linear and reticular scarring change. Mild diffuse bronchial thickening. Mild to moderate emphysematous disease is seen with centrilobular changes predominating. There is a 3 mm right upper lobe apical nodule on series 4 axial 32, 3 mm posterior right apical nodule on image 33, 3 mm nodule in the anterior right upper lobe base on image 68, 4 mm right middle lobe nodule on image 75 and a 6 mm subpleural right lower lobe lateral basal nodule on image 94. No nodules in the left lung. There is no consolidation or effusion bilaterally. No pneumothorax. ABDOMEN AND PELVIS: LIVER: The liver is unremarkable. GALLBLADDER AND BILE DUCTS: Gallbladder is unremarkable. No biliary ductal dilatation. SPLEEN: No acute abnormality. PANCREAS: No acute abnormality. ADRENAL GLANDS: No acute abnormality. There is no adrenal mass. KIDNEYS, URETERS AND BLADDER: No stones in the kidneys or ureters. No hydronephrosis. No perinephric or periureteral stranding. No renal mass enhancement. Urinary bladder is unremarkable. GI AND BOWEL: There is irregular mucosal fold thickening and enhancement in the antropyloric portion of the stomach consistent with gastritis or peptic ulcer disease. Endoscopy may be indicated given the clinical history of anemia. The stomach is distended with fluid and food products. The small bowel is normal in caliber without inflammatory change. The appendix  is normal. There is either mild wall thickening or changes of underdistension in the mid to distal sigmoid colon.  Correlate clinically for underlying colitis. There is no bowel obstruction. REPRODUCTIVE ORGANS: There is left greater than right gonadal vein engorgement and pelvic venous congestion. The uterus and ovaries are not enlarged. PERITONEUM AND RETROPERITONEUM: No ascites. No free air. VASCULATURE: Aorta is normal in caliber. There is mild aortic atherosclerosis. No abdominal aortic aneurysm is seen. ABDOMINAL AND PELVIS LYMPH NODES: No lymphadenopathy. BONES AND SOFT TISSUES: There is thoracic spondylosis with no acute osseous findings. Multilevel thoracic degenerative disc disease. There is advanced degenerative disc disease at L5-S1 with vacuum phenomenon. No acute or significant osseous findings are seen elsewhere. No focal soft tissue abnormality. IMPRESSION: 1. Irregular mucosal fold thickening and enhancement in the antropyloric stomach consistent with gastritis or peptic ulcer disease; endoscopy is recommended given anemia and symptoms. 2. Findings in the mid to distal sigmoid colon may reflect mild wall thickening versus underdistension; clinical correlation recommended for underlying colitis. 3. Multiple small right lung nodules, largest 6 mm in the right lower lobe subpleural region; recommend non-contrast chest CT at 612 months, then consider 1824 months follow-up per Fleischner Society guidelines given unknown risk. 4. Mild to moderate emphysema with centrilobular predominance; consider evaluation for eligibility for a low-dose CT lung cancer screening program. 5. 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 6. Aortic atherosclerosis . Electronically signed by: Francis Quam MD 02/27/2024 09:12 PM EST RP Workstation: [id number]   DG Chest Portable 1 View Result Date: 02/27/2024 EXAM: 1 VIEW(S) XRAY OF THE CHEST 02/27/2024 04:33:00 PM COMPARISON: None  available. CLINICAL HISTORY: smoker with weight loss, eval for mass FINDINGS: LUNGS AND PLEURA: Peribronchial thickening and perihilar interstitial changes in the lungs. Bronchitic changes in the lungs. No pleural effusion. No pneumothorax. HEART AND MEDIASTINUM: No acute abnormality of the cardiac and mediastinal silhouettes. BONES AND SOFT TISSUES: No acute osseous abnormality. IMPRESSION: 1. Bronchitic changes in the lungs. No focal mass or consolidation. Electronically signed by: Elsie Gravely MD 02/27/2024 05:20 PM EST RP Workstation: [id number]     Assessment and plan-   # Severe macrocytic anemia and leukopenia Status post PRBC transfusion and hemoglobin increased appropriately. Patient has mild increase of total bilirubin, with mild increase of indirect bilirubin, indicating this could be secondary to B12 deficiency.  LDH is significantly elevated.  Haptoglobin is pending. This is possible a case of severe macrocytic anemia with leukopenia secondary to B12 deficiency.  However the degree of anemia out of proportion to the severity of hemolysis.  I recommend a bone marrow biopsy for further evaluation to rule out underlying bone marrow disorders. Patient will follow-up with me outpatient to go over the bone marrow biopsy results.  # B12 deficiency, recommend vitamin B12 1000 mcg injections I placed order to start daily injection today and she will finish additional injections outpatient. check intrinsic antibody and parietal antibody workup outpatient. If pernicious anemia is confirmed, recommend outpatient GI workup.  # Lung nodules, tobacco use.  She will need to establish care with lung cancer screening program outpatient.   Thank you for allowing me to participate in the care of this patient.   Zelphia Cap, MD, PhD Hematology Oncology 02/28/2024

## 2024-02-28 NOTE — Progress Notes (Addendum)
 PROGRESS NOTE    Sonya Wallace  FMW:[id number] DOB: [birth year] DOA: 02/27/2024 PCP: Patient, No Pcp Per  Outpatient Specialists: none    Brief Narrative:   Sonya Wallace is a 63 y.o. female with medical history significant for tobacco abuse presenting with the above.   Several months worsening fatigue and lightheadedness. No melena or other bleeding. Some abdominal discomfort and occasionally gets nauseaus and vomits when hungry. No chest pain or palpitations.   Assessment & Plan:   Principal Problem:   Symptomatic anemia Active Problems:   Tobacco abuse   # Symptomatic anemia # Macrocytic anemia # Leukopenia Normal hgb 4 years ago, was 7.8 in July, now in the 4s. Mcv markedly elevated, wbcs low. Elevated bili and ldh. Hemolysis, bone marrow process in ddx. Given 2 units on 11/24 and robust response to 8s today - additional laboratory w/u ordered, appreciate hematology's involvement - bone marrow biopsy ordered  # Recurrent nausea/vomiting # Gastric abnormality Patient reports history of abdominal discomfort, nausea, and sometimes vomiting when she doesn't eat. CT shows sings gastritis, possible peptic ulcer disease - depending on course here and results of anemia w/u may need inpatient EGD - for now will treat with PPI IV  # Elevated TSH - f/u T4  # HCM - f/u hiv, hcv, A1c, lipids   # Tobacco abuse Declines patch   DVT prophylaxis: SCDs Code Status: full Family Communication: daughter at bedside 11/25  Level of care: Med-Surg Status is: Inpatient Remains inpatient appropriate because: severity of illness    Consultants:  Hematology IR  Procedures: pending  Antimicrobials:  none    Subjective: Reports feeling fine  Objective: Vitals:   02/28/24 0009 02/28/24 0059 02/28/24 0451 02/28/24 0829  BP:  (!) 140/69 (!) 147/78 (!) 148/67  Pulse:  74 75 72  Resp:  16 16 15   Temp:   97.8 F (36.6 C) 97.7 F (36.5 C)  TempSrc:   Oral Oral  SpO2:  100% 98% 98% 99%  Weight:      Height:        Intake/Output Summary (Last 24 hours) at 02/28/2024 0851 Last data filed at 02/28/2024 0009 Gross per 24 hour  Intake 1320 ml  Output --  Net 1320 ml   Filed Weights   02/27/24 1902  Weight: 65.8 kg    Examination:  General exam: Appears calm and comfortable  Respiratory system: Clear to auscultation. Respiratory effort normal. Cardiovascular system: S1 & S2 heard, RRR.   Gastrointestinal system: Abdomen is nondistended, soft and nontender. No organomegaly or masses felt.   Central nervous system: Alert and oriented. No focal neurological deficits. Extremities: Symmetric 5 x 5 power. Skin: No rashes, lesions or ulcers Psychiatry: Judgement and insight appear normal. Mood & affect appropriate.     Data Reviewed: I have personally reviewed following labs and imaging studies  CBC: Recent Labs  Lab 02/27/24 1407 02/27/24 1558 02/28/24 0036 02/28/24 0434  WBC 2.0*  --  2.8* 2.2*  NEUTROABS  --   --  1.5*  --   HGB 4.6* 4.4* 9.5* 8.7*  HCT 13.3* 12.4* 27.4* 25.4*  MCV 126.7*  --  100.0 100.8*  PLT 168  --  142* 124*   Basic Metabolic Panel: Recent Labs  Lab 02/27/24 1407 02/28/24 0434  NA 136 136  K 5.0 4.2  CL 102 102  CO2 23 26  GLUCOSE 131* 92  BUN 9 9  CREATININE 0.69 0.74  CALCIUM 9.0 8.9   GFR: Estimated Creatinine Clearance:  65.8 mL/min (by C-G formula based on SCr of 0.74 mg/dL). Liver Function Tests: Recent Labs  Lab 02/27/24 1407 02/27/24 1550 02/28/24 0434  AST 38  --  33  ALT 15  --  14  ALKPHOS 42  --  42  BILITOT 1.7* 1.7* 1.3*  PROT 6.7  --  6.3*  ALBUMIN 4.4  --  4.2   Recent Labs  Lab 02/27/24 1407  LIPASE 30   No results for input(s): AMMONIA in the last 168 hours. Coagulation Profile: No results for input(s): INR, PROTIME in the last 168 hours. Cardiac Enzymes: No results for input(s): CKTOTAL, CKMB, CKMBINDEX, TROPONINI in the last 168 hours. BNP (last 3  results) No results for input(s): PROBNP in the last 8760 hours. HbA1C: No results for input(s): HGBA1C in the last 72 hours. CBG: Recent Labs  Lab 02/27/24 1410  GLUCAP 115*   Lipid Profile: No results for input(s): CHOL, HDL, LDLCALC, TRIG, CHOLHDL, LDLDIRECT in the last 72 hours. Thyroid Function Tests: Recent Labs    02/27/24 1550  TSH 22.500*   Anemia Panel: Recent Labs    02/27/24 1550 02/28/24 0434  FOLATE  --  11.1  RETICCTPCT 2.1  --    Urine analysis:    Component Value Date/Time   COLORURINE YELLOW (A) 02/28/2024 0036   APPEARANCEUR CLEAR (A) 02/28/2024 0036   LABSPEC >1.046 (H) 02/28/2024 0036   PHURINE 6.0 02/28/2024 0036   GLUCOSEU NEGATIVE 02/28/2024 0036   HGBUR NEGATIVE 02/28/2024 0036   BILIRUBINUR NEGATIVE 02/28/2024 0036   KETONESUR NEGATIVE 02/28/2024 0036   PROTEINUR NEGATIVE 02/28/2024 0036   NITRITE NEGATIVE 02/28/2024 0036   LEUKOCYTESUR NEGATIVE 02/28/2024 0036   Sepsis Labs: @LABRCNTIP (procalcitonin:4,lacticidven:4)  )No results found for this or any previous visit (from the past 240 hours).       Radiology Studies: CT CHEST ABDOMEN PELVIS W CONTRAST Result Date: 02/27/2024 EXAM: CT CHEST, ABDOMEN AND PELVIS WITH CONTRAST 02/27/2024 08:40:53 PM TECHNIQUE: CT of the chest, abdomen and pelvis was performed with the administration of 100 mL of iohexol  (OMNIPAQUE ) 300 MG/ML solution. Multiplanar reformatted images are provided for review. Automated exposure control, iterative reconstruction, and/or weight based adjustment of the mA/kV was utilized to reduce the radiation dose to as low as reasonably achievable. COMPARISON: None available. CLINICAL HISTORY: Abdominal pain, vomiting, anemia, several months of worsening fatigue and lightheadedness. FINDINGS: CHEST: MEDIASTINUM AND LYMPH NODES: Heart and pericardium are unremarkable. No visible coronary artery calcification and no pericardial effusion. The central airways are  clear. The thoracic aorta is slightly tortuous with minimal calcific plaques of the descending segment and proximal right subclavian artery. The great vessels and the aorta are otherwise free of visible plaques. There is no aortic or great vessel aneurysm, stenosis or dissection. No mediastinal, hilar or axillary lymphadenopathy. LUNGS AND PLEURA: There is bilateral apical linear and reticular scarring change. Mild diffuse bronchial thickening. Mild to moderate emphysematous disease is seen with centrilobular changes predominating. There is a 3 mm right upper lobe apical nodule on series 4 axial 32, 3 mm posterior right apical nodule on image 33, 3 mm nodule in the anterior right upper lobe base on image 68, 4 mm right middle lobe nodule on image 75 and a 6 mm subpleural right lower lobe lateral basal nodule on image 94. No nodules in the left lung. There is no consolidation or effusion bilaterally. No pneumothorax. ABDOMEN AND PELVIS: LIVER: The liver is unremarkable. GALLBLADDER AND BILE DUCTS: Gallbladder is unremarkable. No biliary ductal dilatation.  SPLEEN: No acute abnormality. PANCREAS: No acute abnormality. ADRENAL GLANDS: No acute abnormality. There is no adrenal mass. KIDNEYS, URETERS AND BLADDER: No stones in the kidneys or ureters. No hydronephrosis. No perinephric or periureteral stranding. No renal mass enhancement. Urinary bladder is unremarkable. GI AND BOWEL: There is irregular mucosal fold thickening and enhancement in the antropyloric portion of the stomach consistent with gastritis or peptic ulcer disease. Endoscopy may be indicated given the clinical history of anemia. The stomach is distended with fluid and food products. The small bowel is normal in caliber without inflammatory change. The appendix is normal. There is either mild wall thickening or changes of underdistension in the mid to distal sigmoid colon. Correlate clinically for underlying colitis. There is no bowel obstruction.  REPRODUCTIVE ORGANS: There is left greater than right gonadal vein engorgement and pelvic venous congestion. The uterus and ovaries are not enlarged. PERITONEUM AND RETROPERITONEUM: No ascites. No free air. VASCULATURE: Aorta is normal in caliber. There is mild aortic atherosclerosis. No abdominal aortic aneurysm is seen. ABDOMINAL AND PELVIS LYMPH NODES: No lymphadenopathy. BONES AND SOFT TISSUES: There is thoracic spondylosis with no acute osseous findings. Multilevel thoracic degenerative disc disease. There is advanced degenerative disc disease at L5-S1 with vacuum phenomenon. No acute or significant osseous findings are seen elsewhere. No focal soft tissue abnormality. IMPRESSION: 1. Irregular mucosal fold thickening and enhancement in the antropyloric stomach consistent with gastritis or peptic ulcer disease; endoscopy is recommended given anemia and symptoms. 2. Findings in the mid to distal sigmoid colon may reflect mild wall thickening versus underdistension; clinical correlation recommended for underlying colitis. 3. Multiple small right lung nodules, largest 6 mm in the right lower lobe subpleural region; recommend non-contrast chest CT at 612 months, then consider 1824 months follow-up per Fleischner Society guidelines given unknown risk. 4. Mild to moderate emphysema with centrilobular predominance; consider evaluation for eligibility for a low-dose CT lung cancer screening program. 5. 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 6. Aortic atherosclerosis . Electronically signed by: Francis Quam MD 02/27/2024 09:12 PM EST RP Workstation: [id number]   DG Chest Portable 1 View Result Date: 02/27/2024 EXAM: 1 VIEW(S) XRAY OF THE CHEST 02/27/2024 04:33:00 PM COMPARISON: None available. CLINICAL HISTORY: smoker with weight loss, eval for mass FINDINGS: LUNGS AND PLEURA: Peribronchial thickening and perihilar interstitial changes in the lungs. Bronchitic changes in the  lungs. No pleural effusion. No pneumothorax. HEART AND MEDIASTINUM: No acute abnormality of the cardiac and mediastinal silhouettes. BONES AND SOFT TISSUES: No acute osseous abnormality. IMPRESSION: 1. Bronchitic changes in the lungs. No focal mass or consolidation. Electronically signed by: Elsie Gravely MD 02/27/2024 05:20 PM EST RP Workstation: [id number]        Scheduled Meds: Continuous Infusions:  sodium chloride  Stopped (02/27/24 1623)     LOS: 1 day     Devaughn KATHEE Ban, MD [facility]   If 7PM-7AM, please contact night-coverage [email or web address] Password [facility] 02/28/2024, 8:51 AM

## 2024-02-28 NOTE — ED Notes (Signed)
 2nd unit of PRBCS infused without reaction at 00:00, resent cbc/diff around 00:30

## 2024-02-28 NOTE — Consult Note (Signed)
 Chief Complaint:  Symptomatic anemia  Procedure: Bone Marrow Biopsy with Aspiration  Referring Provider(s): Dr. Devaughn Ban / Dr. Zelphia Cap  Supervising Physician: Philip Cornet  Patient Status: [facility] - In-pt  History of Present Illness: Sonya Wallace is a 63 y.o. female with no significant history who presented to the ED on 11/24 with several concerns including generalized weakness, dizziness, and palpitations. Patient reports that for the past month she has been experiencing dizziness and weakness with ambulation. She reports associated nausea intermittently, but denies ever falling. Per notes, she also reports ~5lb weight loss in the past month. ED workup revealed labwork concerning for significant anemia with an initial H/H of 4.6/13.3 and repeat of 4.4/12.4. She was also noted to have leukopenia of 2.0, RBC of 1.05, TSH of 22.5, Folate WNL, and Vitamin B12 of <150. Smear unremarkable, Myeloma panel, and Kappa/Lambda light chains pending. Patient underwent multiple transfusions of PBRC while in the ED. Repeat H/H this morning improved to 9.5/27.4. Heme/Onc consulted for further management and IR consulted for possible bone marrow biopsy.  Patient is resting in bed in no acute distress with family at the bedside. Reports continued dizziness with ambulation, but denies any shortness of breath, chest pain, palpitations, or blood in her stool. Had a long discussion with patient and Dr. Cap at the bedside regarding bone marrow biopsy. Patient is agreeable to proceed with biopsy tomorrow morning. All questions and concerns answered at the bedside.   Patient is Full Code  History reviewed. No pertinent past medical history.  History reviewed. No pertinent surgical history.  Allergies: Patient has no known allergies.  Medications: Prior to Admission medications   Medication Sig Start Date End Date Taking? Authorizing Provider  albuterol  (PROVENTIL  HFA;VENTOLIN  HFA) 108 (90 Base) MCG/ACT inhaler  Inhale 2 puffs into the lungs every 6 (six) hours as needed for wheezing or shortness of breath. Patient not taking: Reported on 02/27/2024 03/23/18   Alona Knee, PA-C  Blood Pressure Monitoring (BLOOD PRESSURE CUFF) MISC 1 Units by Does not apply route 3 (three) times daily. 03/23/18   Alona Knee, PA-C  erythromycin  ophthalmic ointment Apply to area on upper and lower lid Patient not taking: Reported on 02/27/2024 11/22/22   Gasper Devere ORN, PA-C  meclizine (ANTIVERT) 25 MG tablet Take 25 mg by mouth 3 (three) times daily as needed. Patient not taking: Reported on 02/27/2024 11/06/23   [provider]  predniSONE  (STERAPRED UNI-PAK 21 TAB) 10 MG (21) TBPK tablet Take 6 pills on day one then decrease by 1 pill each day Patient not taking: Reported on 02/27/2024 02/12/21   Gasper Devere ORN, PA-C  tobramycin  (TOBREX ) 0.3 % ophthalmic solution Place 2 drops into the right eye every 4 (four) hours. Patient not taking: Reported on 02/27/2024 11/22/22   Gasper Devere ORN, PA-C     History reviewed. No pertinent family history.  Social History   Socioeconomic History   Marital status: Married    Spouse name: Not on file   Number of children: Not on file   Years of education: Not on file   Highest education level: Not on file  Occupational History   Not on file  Tobacco Use   Smoking status: Every Day   Smokeless tobacco: Never  Substance and Sexual Activity   Alcohol use: No   Drug use: No   Sexual activity: Not on file  Other Topics Concern   Not on file  Social History Narrative   Not on file  Social Drivers of Corporate Investment Banker Strain: Not on file  Food Insecurity: Not on file  Transportation Needs: Not on file  Physical Activity: Not on file  Stress: Not on file  Social Connections: Not on file    Review of Systems  Cardiovascular:  Positive for palpitations (intermittent).  Neurological:  Positive for dizziness (with ambulation) and  light-headedness.  Patient denies any headache, chest pain, shortness of breath, abdominal pain, N/V, or fever/chills. All other systems are negative.   Vital Signs: BP (!) 145/77   Pulse 70   Temp 97.7 F (36.5 C) (Oral)   Resp 13   Ht 5' 2 (1.575 m)   Wt 145 lb 1 oz (65.8 kg)   SpO2 100%   BMI 26.53 kg/m     Physical Exam Vitals reviewed.  Constitutional:      Appearance: Normal appearance.  HENT:     Mouth/Throat:     Mouth: Mucous membranes are moist.     Pharynx: Oropharynx is clear.  Cardiovascular:     Rate and Rhythm: Normal rate and regular rhythm.  Pulmonary:     Effort: Pulmonary effort is normal.  Abdominal:     General: Abdomen is flat.     Palpations: Abdomen is soft.     Tenderness: There is no abdominal tenderness.  Skin:    General: Skin is warm and dry.  Neurological:     Mental Status: She is alert and oriented to person, place, and time.  Psychiatric:        Behavior: Behavior normal.     Imaging: CT CHEST ABDOMEN PELVIS W CONTRAST Result Date: 02/27/2024 EXAM: CT CHEST, ABDOMEN AND PELVIS WITH CONTRAST 02/27/2024 08:40:53 PM TECHNIQUE: CT of the chest, abdomen and pelvis was performed with the administration of 100 mL of iohexol  (OMNIPAQUE ) 300 MG/ML solution. Multiplanar reformatted images are provided for review. Automated exposure control, iterative reconstruction, and/or weight based adjustment of the mA/kV was utilized to reduce the radiation dose to as low as reasonably achievable. COMPARISON: None available. CLINICAL HISTORY: Abdominal pain, vomiting, anemia, several months of worsening fatigue and lightheadedness. FINDINGS: CHEST: MEDIASTINUM AND LYMPH NODES: Heart and pericardium are unremarkable. No visible coronary artery calcification and no pericardial effusion. The central airways are clear. The thoracic aorta is slightly tortuous with minimal calcific plaques of the descending segment and proximal right subclavian artery. The great  vessels and the aorta are otherwise free of visible plaques. There is no aortic or great vessel aneurysm, stenosis or dissection. No mediastinal, hilar or axillary lymphadenopathy. LUNGS AND PLEURA: There is bilateral apical linear and reticular scarring change. Mild diffuse bronchial thickening. Mild to moderate emphysematous disease is seen with centrilobular changes predominating. There is a 3 mm right upper lobe apical nodule on series 4 axial 32, 3 mm posterior right apical nodule on image 33, 3 mm nodule in the anterior right upper lobe base on image 68, 4 mm right middle lobe nodule on image 75 and a 6 mm subpleural right lower lobe lateral basal nodule on image 94. No nodules in the left lung. There is no consolidation or effusion bilaterally. No pneumothorax. ABDOMEN AND PELVIS: LIVER: The liver is unremarkable. GALLBLADDER AND BILE DUCTS: Gallbladder is unremarkable. No biliary ductal dilatation. SPLEEN: No acute abnormality. PANCREAS: No acute abnormality. ADRENAL GLANDS: No acute abnormality. There is no adrenal mass. KIDNEYS, URETERS AND BLADDER: No stones in the kidneys or ureters. No hydronephrosis. No perinephric or periureteral stranding. No renal mass enhancement. Urinary  bladder is unremarkable. GI AND BOWEL: There is irregular mucosal fold thickening and enhancement in the antropyloric portion of the stomach consistent with gastritis or peptic ulcer disease. Endoscopy may be indicated given the clinical history of anemia. The stomach is distended with fluid and food products. The small bowel is normal in caliber without inflammatory change. The appendix is normal. There is either mild wall thickening or changes of underdistension in the mid to distal sigmoid colon. Correlate clinically for underlying colitis. There is no bowel obstruction. REPRODUCTIVE ORGANS: There is left greater than right gonadal vein engorgement and pelvic venous congestion. The uterus and ovaries are not enlarged.  PERITONEUM AND RETROPERITONEUM: No ascites. No free air. VASCULATURE: Aorta is normal in caliber. There is mild aortic atherosclerosis. No abdominal aortic aneurysm is seen. ABDOMINAL AND PELVIS LYMPH NODES: No lymphadenopathy. BONES AND SOFT TISSUES: There is thoracic spondylosis with no acute osseous findings. Multilevel thoracic degenerative disc disease. There is advanced degenerative disc disease at L5-S1 with vacuum phenomenon. No acute or significant osseous findings are seen elsewhere. No focal soft tissue abnormality. IMPRESSION: 1. Irregular mucosal fold thickening and enhancement in the antropyloric stomach consistent with gastritis or peptic ulcer disease; endoscopy is recommended given anemia and symptoms. 2. Findings in the mid to distal sigmoid colon may reflect mild wall thickening versus underdistension; clinical correlation recommended for underlying colitis. 3. Multiple small right lung nodules, largest 6 mm in the right lower lobe subpleural region; recommend non-contrast chest CT at 612 months, then consider 1824 months follow-up per Fleischner Society guidelines given unknown risk. 4. Mild to moderate emphysema with centrilobular predominance; consider evaluation for eligibility for a low-dose CT lung cancer screening program. 5. 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 6. Aortic atherosclerosis . Electronically signed by: Francis Quam MD 02/27/2024 09:12 PM EST RP Workstation: [id number]   DG Chest Portable 1 View Result Date: 02/27/2024 EXAM: 1 VIEW(S) XRAY OF THE CHEST 02/27/2024 04:33:00 PM COMPARISON: None available. CLINICAL HISTORY: smoker with weight loss, eval for mass FINDINGS: LUNGS AND PLEURA: Peribronchial thickening and perihilar interstitial changes in the lungs. Bronchitic changes in the lungs. No pleural effusion. No pneumothorax. HEART AND MEDIASTINUM: No acute abnormality of the cardiac and mediastinal silhouettes. BONES AND SOFT  TISSUES: No acute osseous abnormality. IMPRESSION: 1. Bronchitic changes in the lungs. No focal mass or consolidation. Electronically signed by: Elsie Gravely MD 02/27/2024 05:20 PM EST RP Workstation: [id number]    Labs:  CBC: Recent Labs    10/07/23 1031 02/27/24 1407 02/27/24 1558 02/28/24 0036 02/28/24 0434  WBC 2.8* 2.0*  --  2.8* 2.2*  HGB 7.8* 4.6* 4.4* 9.5* 8.7*  HCT 22.3* 13.3* 12.4* 27.4* 25.4*  PLT 264 168  --  142* 124*    COAGS: No results for input(s): INR, APTT in the last 8760 hours.  BMP: Recent Labs    10/07/23 1031 02/27/24 1407 02/28/24 0434  NA 138 136 136  K 3.8 5.0 4.2  CL 105 102 102  CO2 23 23 26   GLUCOSE 101* 131* 92  BUN 10 9 9   CALCIUM 8.9 9.0 8.9  CREATININE 0.60 0.69 0.74  GFRNONAA >60 >60 >60    LIVER FUNCTION TESTS: Recent Labs    10/07/23 1031 02/27/24 1407 02/27/24 1550 02/28/24 0434  BILITOT 0.9 1.7* 1.7* 1.3*  AST 19 38  --  33  ALT 13 15  --  14  ALKPHOS 55 42  --  42  PROT 6.6 6.7  --  6.3*  ALBUMIN 3.9 4.4  --  4.2    TUMOR MARKERS: No results for input(s): AFPTM, CEA, CA199, CHROMGRNA in the last 8760 hours.  Assessment and Plan:  Symptomatic Anemia: Sonya Wallace is a 63 y.o. female with a history of dizziness, weakness, and palpitations with ambulation for the past month who presented to the ED on 11/24 with several complaints. Found to be severely anemic. Underwent multiple transfusions in the ED with some symptomatic improvement per patient. IR consulted for possible bone marrow biopsy. Procedure to be performed under moderate sedation.  -NPO at MN -INR pending  -Repeat CBC for the morning -Plan for bone marrow biopsy in IR at 8am on 11/26  Risks and benefits of bone marrow biopsy was discussed with the patient and/or patient's family including, but not limited to bleeding, infection, damage to adjacent structures or low yield requiring additional tests.  All of the questions were answered  and there is agreement to proceed.  Consent signed and in chart.   Thank you for allowing our service to participate in Sonya Wallace 's care.    Electronically Signed: Uriel Dowding M Glennie Rodda, PA-C   02/28/2024, 3:26 PM     I spent a total of 40 Minutes in face to face in clinical consultation, greater than 50% of which was counseling/coordinating care for bone marrow biopsy.

## 2024-02-29 ENCOUNTER — Inpatient Hospital Stay: Payer: MEDICAID | Admitting: Radiology

## 2024-02-29 ENCOUNTER — Telehealth: Payer: Self-pay | Admitting: Osteopathic Medicine

## 2024-02-29 HISTORY — PX: IR BONE MARROW BIOPSY & ASPIRATION: IMG5727

## 2024-02-29 LAB — CBC WITH DIFFERENTIAL/PLATELET
Abs Immature Granulocytes: 0.02 K/uL (ref 0.00–0.07)
Abs Immature Granulocytes: 0.02 K/uL (ref 0.00–0.07)
Basophils Absolute: 0 K/uL (ref 0.0–0.1)
Basophils Absolute: 0 K/uL (ref 0.0–0.1)
Basophils Relative: 0 %
Basophils Relative: 0 %
Eosinophils Absolute: 0.1 K/uL (ref 0.0–0.5)
Eosinophils Absolute: 0.1 K/uL (ref 0.0–0.5)
Eosinophils Relative: 5 %
Eosinophils Relative: 4 %
HCT: 24.7 % — ABNORMAL LOW (ref 36.0–46.0)
HCT: 25.1 % — ABNORMAL LOW (ref 36.0–46.0)
Hemoglobin: 8.7 g/dL — ABNORMAL LOW (ref 12.0–15.0)
Hemoglobin: 8.8 g/dL — ABNORMAL LOW (ref 12.0–15.0)
Immature Granulocytes: 1 %
Immature Granulocytes: 1 %
Lymphocytes Relative: 28 %
Lymphocytes Relative: 28 %
Lymphs Abs: 0.5 K/uL — ABNORMAL LOW (ref 0.7–4.0)
Lymphs Abs: 0.5 K/uL — ABNORMAL LOW (ref 0.7–4.0)
MCH: 34.7 pg — ABNORMAL HIGH (ref 26.0–34.0)
MCH: 34.5 pg — ABNORMAL HIGH (ref 26.0–34.0)
MCHC: 35.2 g/dL (ref 30.0–36.0)
MCHC: 35.1 g/dL (ref 30.0–36.0)
MCV: 98.4 fL (ref 80.0–100.0)
MCV: 98.4 fL (ref 80.0–100.0)
Monocytes Absolute: 0 K/uL — ABNORMAL LOW (ref 0.1–1.0)
Monocytes Absolute: 0 K/uL — ABNORMAL LOW (ref 0.1–1.0)
Monocytes Relative: 2 %
Monocytes Relative: 2 %
Neutro Abs: 1.2 K/uL — ABNORMAL LOW (ref 1.7–7.7)
Neutro Abs: 1.2 K/uL — ABNORMAL LOW (ref 1.7–7.7)
Neutrophils Relative %: 64 %
Neutrophils Relative %: 65 %
Platelets: 127 K/uL — ABNORMAL LOW (ref 150–400)
Platelets: 122 K/uL — ABNORMAL LOW (ref 150–400)
RBC: 2.51 MIL/uL — ABNORMAL LOW (ref 3.87–5.11)
RBC: 2.55 MIL/uL — ABNORMAL LOW (ref 3.87–5.11)
RDW: 23.7 % — ABNORMAL HIGH (ref 11.5–15.5)
RDW: 23.7 % — ABNORMAL HIGH (ref 11.5–15.5)
Smear Review: NORMAL
Smear Review: NORMAL
WBC: 1.9 K/uL — ABNORMAL LOW (ref 4.0–10.5)
WBC: 1.9 K/uL — ABNORMAL LOW (ref 4.0–10.5)
nRBC: 0 % (ref 0.0–0.2)
nRBC: 0 % (ref 0.0–0.2)

## 2024-02-29 LAB — LIPID PANEL
Cholesterol: 125 mg/dL (ref 0–200)
HDL: 30 mg/dL — ABNORMAL LOW (ref >40)
LDL Cholesterol: 63 mg/dL (ref 0–99)
Total CHOL/HDL Ratio: 4.2 ratio
Triglycerides: 164 mg/dL — ABNORMAL HIGH (ref <150)
VLDL: 33 mg/dL (ref 0–40)

## 2024-02-29 LAB — HEMOGLOBIN A1C
Hgb A1c MFr Bld: 5.8 % — ABNORMAL HIGH (ref 4.8–5.6)
Mean Plasma Glucose: 120 mg/dL

## 2024-02-29 LAB — KAPPA/LAMBDA LIGHT CHAINS
Kappa free light chain: 84.5 mg/L — ABNORMAL HIGH (ref 3.3–19.4)
Kappa, lambda light chain ratio: 9.29 — ABNORMAL HIGH (ref 0.26–1.65)
Lambda free light chains: 9.1 mg/L (ref 5.7–26.3)

## 2024-02-29 LAB — PROTIME-INR
INR: 1.1 (ref 0.8–1.2)
Prothrombin Time: 14.4 s (ref 11.4–15.2)

## 2024-02-29 MED ORDER — HEPARIN SOD (PORK) LOCK FLUSH 100 UNIT/ML IV SOLN
INTRAVENOUS | Status: AC
  Filled 2024-02-29: qty 5

## 2024-02-29 MED ORDER — PANTOPRAZOLE SODIUM 40 MG PO TBEC: 40.0000 mg | DELAYED_RELEASE_TABLET | Freq: Two times a day (BID) | ORAL | Status: DC

## 2024-02-29 MED ORDER — MIDAZOLAM HCL 2 MG/2ML IJ SOLN
INTRAMUSCULAR | Status: AC
Start: 2024-02-29 — End: 2024-02-29
  Filled 2024-02-29: qty 2

## 2024-02-29 MED ORDER — FENTANYL CITRATE (PF) 100 MCG/2ML IJ SOLN
INTRAMUSCULAR | Status: AC | PRN
Start: 2024-02-29 — End: 2024-02-29
  Administered 2024-02-29: 50 ug via INTRAVENOUS

## 2024-02-29 MED ORDER — LEVOTHYROXINE SODIUM 100 MCG PO TABS: 100.0000 ug | ORAL_TABLET | Freq: Every day | ORAL | 0 refills | Status: AC

## 2024-02-29 MED ORDER — MIDAZOLAM HCL (PF) 2 MG/2ML IJ SOLN
INTRAMUSCULAR | Status: AC | PRN
  Administered 2024-02-29: 1 mg via INTRAVENOUS

## 2024-02-29 MED ORDER — FENTANYL CITRATE (PF) 100 MCG/2ML IJ SOLN
INTRAMUSCULAR | Status: AC
  Filled 2024-02-29: qty 2

## 2024-02-29 MED ORDER — LIDOCAINE 1 % OPTIME INJ - NO CHARGE
8.0000 mL | Freq: Once | INTRAMUSCULAR | Status: AC
  Administered 2024-02-29: 8 mL via INTRADERMAL

## 2024-02-29 MED ORDER — PANTOPRAZOLE SODIUM 40 MG PO TBEC: 40.0000 mg | DELAYED_RELEASE_TABLET | Freq: Every day | ORAL | Status: DC

## 2024-02-29 MED ORDER — PANTOPRAZOLE SODIUM 40 MG PO TBEC: DELAYED_RELEASE_TABLET | ORAL | 0 refills | Status: AC

## 2024-02-29 MED ORDER — SODIUM CHLORIDE 0.9 % IV SOLN: INTRAVENOUS | Status: DC

## 2024-02-29 NOTE — Hospital Course (Signed)
 Hospital course / significant events:   HPI: ***  ***     Consultants:  ***  Procedures/Surgeries: ***      ASSESSMENT & PLAN:    # Severe macrocytic anemia and leukopenia Status post PRBC transfusion and hemoglobin increased appropriately. Patient has mild increase of total bilirubin, with mild increase of indirect bilirubin, indicating this could be secondary to B12 deficiency.  LDH is significantly elevated.  Haptoglobin is pending. This is possible a case of severe macrocytic anemia with leukopenia secondary to B12 deficiency.  However the degree of anemia out of proportion to the severity of hemolysis.  I recommend a bone marrow biopsy for further evaluation to rule out underlying bone marrow disorders. Patient will follow-up with me outpatient to go over the bone marrow biopsy results.   # B12 deficiency, recommend vitamin B12 1000 mcg injections I placed order to start daily injection today and she will finish additional injections outpatient. check intrinsic antibody and parietal antibody workup outpatient. If pernicious anemia is confirmed, recommend outpatient GI workup.   # Lung nodules, tobacco use.  She will need to establish care with lung cancer screening program outpatient.     *** based on BMI: Body mass index is 26.53 kg/m.SABRA Significantly low or high BMI is associated with higher medical risk.  Underweight - under 18  overweight - 25 to 29 obese - 30 or more Class 1 obesity: BMI of 30.0 to 34 Class 2 obesity: BMI of 35.0 to 39 Class 3 obesity: BMI of 40.0 to 49 Super Morbid Obesity: BMI 50-59 Super-super Morbid Obesity: BMI 60+ Healthy nutrition and physical activity advised as adjunct to other disease management and risk reduction treatments    DVT prophylaxis: *** IV fluids: *** continuous IV fluids  Nutrition: *** Central lines / other devices: ***  Code Status: *** ACP documentation reviewed: *** none on file in [facility]  TOC needs:  *** Medical barriers to dispo: ***. Expected medical readiness for discharge ***.

## 2024-02-29 NOTE — Discharge Instructions (Addendum)
 Transportation Resources for [employer]  Agency Name: [facility] Agency Address: [address] [place], [place] [address] Phone: [phone] Email: [email or web address] Website: [email or web address] Service(s) Offered: Housing services, self-sufficiency, congregate meal program, weatherization program, field seismologist program, emergency food assistance,  housing counseling, home ownership program, wheels-towork program.  Agency Name: [facility] [employer] [phone]) Address: [address], [place], [place] [address] Phone: [phone] Website: [email or web address] Service(s) Offered: Transportation for bluelinx, subscription and demand response; Dial-a-Ride for citizens 62 years of age or older.  Agency Name: Department of Social Services Address: [address] [place], [place] [address] Phone: [phone] Service(s) Offered: Child support services; child welfare services; food stamps; Medicaid; work first family assistance; and aid with fuel,  rent, food and medicine, transportation assistance.  Agency Name: Disabled [employer] (DAV) Transportation  Network Phone: [phone] Service(s) Offered: Transports veterans to the [facility] medical center. Call  forty-eight hours in advance and leave the name, telephone  number, date, and time of appointment. Veteran will be  contacted by the driver the day before the appointment to  arrange a pick up point    [employer] ACTA currently provides door to door services. ACTA connects with PART daily for services to [facility]. ACTA also performs contract services to [employer] operates 27 vehicles, all but 3 mini-vans are equipped with lifts for special needs as well as the general public. ACTA drivers are each CDL certified and trained in First Aid and CPR. ACTA was established in 2002 by [employer]. An independent Industrial/product Designer. ACTA operates via cytogeneticist with required local 10% match funding from [place]. ACTA provides over 80,000 passenger trips each year, including [facility] and [employer] sites.  Call at least by 11 AM one business day prior to needing transportation  [employer].                      [place], [place] [address]     Office Hours: Monday-Friday  8 AM - 5 PM   Agency Name: [facility] Agency Address: [address], [place], [place] [address] Phone: [phone] Website: [email or web address].[email or web address] Service(s) Offered: Housing services, self-sufficiency, congregate meal program, and individual development account program.  Agency Name: [employer] [facility] [place] Address: [address] [place], [place] [address] Phone: [phone] Email: [email or web address] .org Website: [email or web address] Service(s) Offered: Housing the homeless, feeding the hungry, company secretary, job and education related services.  Agency Name: [facility] Address: [address], [place], [place] [address] Phone: [phone] Email: [email or web address] Service(s) Offered: Counseling, problem pregnancy, advocacy for Hispanics, limited emergency financial assistance.  Agency Name: Department of Social Services Address: [address] [place], [place] [address] Phone: [phone] Website: [email or web address] Service(s) Offered: Child support services; child welfare services; SNAP; Medicaid; work first family assistance; and aid with fuel,  rent, food and medicine.  Agency Name: [facility] Address: [address], [place], [place] [address] Phone: [phone] or [phone] Email: [email or web address] Service(s) Offered: Family services and transient assistance; emergency food,  fuel, clothing, limited furniture, utilities; budget counseling, general counseling; give a kid a coat; thrift store; Christmas food and toys. Utility assistance, food pantry, rental  assistance, life sustaining medicine   Do you feel isolated?  The Institute on Aging offers a [employer] that anyone can call toll free at [phone]. The  friendship line is available 24 hours a day  [employer] is a Program of All-inclusive Care for the Elderly (PACE). Their mission is to promote and sustain the independence of seniors wishing to remain in the community. They provide seniors with comprehensive long-term health, social, medical and dietary care. Their program is a safe alternative to nursing home care. [phone]  [facility] Physical Address [facility] [address] [place], [place] [address] Phone: [phone]. . Online zoom yoga class, connect with others without leaving your home [facility] offers [place] dance cardio sessions for individuals via Zoom. This program provides: - Dance fitness activities Please contact program for more information. Servinganyone in need adults 18+ hiv/aids individuals families Call [phone]  Email [email or web address] to get more info  [employer] offers an online [employer] to individuals where they can receive help to focus on their best health. Whether you're a [employer] member or not, the neighborhood center offers a... Main Serviceshealth education  exercise & fitness  community support services  recreation  virtual support Other Servicessupport groups Servinganyone in need adults young adults teens seniors individuals families [email or web address] to get more info  Schedule on their website  The [facility] offers an array of activities for adults age 40 and over. This program provides:- Fitness and health programs- Tech classes- Activity  books Main Serviceshealth education  community support services  exercise & fitness  recreation  more education Servingseniors  Call [phone]    For more resources go online to [email or web address] and type in you zipcode   You are encouraged to call the [facility] and arrange an application appointment to be screened for service to get set up with a physician for primary care  You may print the application and take with you to the appointment. At this web address [email or web address] Please Call [facility] for appointment  [phone]  [address] [place], [place] [address]  Office Hours Monday: Closed Tuesday: 9:00am - 4:00pm Wednesday: 9:00am - 4:00pm Thursday: 9:00am - 8:00pm Friday: Closed  Endocrinology 2nd Thursday of the Month: 5:00pm - 8:00pm  Rheumatology/Orthopedic Clinic By appointment only.  Contact for availability.     Services Not Covered Obstetrics Gastrointestinal/Liver Disease   Some PCP options in [place] area- not a comprehensive list  [facility]- [phone] [facility]- [phone] [facility]- [phone] [facility]- [phone] [facility]- [phone] Nichole [phone]  or [facility] Health Physician Referral Line [phone]   [facility] Primary Care Provider List  [facility] at [facility] [address] [place], [place] [address] [phone]  [facility] at [facility] [address], [place], [place] [address] [phone]  [facility] [address] [place], [place], [place] [address] [phone]  [facility] Primary Care at [facility] [address], [place], [place] [address] [phone]  [facility] Primary Care at [facility] [address], [place], [place] [address] [phone]  [facility] [address] [place], [place], [place] [address] [phone]  [facility] [address], [place], [place] [address] [phone]  [facility] [address], [place], [place] [address] [phone]  [facility] [address], [place], [place] [address] [phone]  [facility] Center [address] [place], [address], [place], [place] [address] [phone]  [facility] [address] [place], [address], [place], [place] [address] [phone]  [facility] [address] [place], [place] [address] [phone]  [facility] Healthcare at [facility] [address] [place], [place], [place] [address] [phone]  [facility] HealthCare at [facility] [address] [place], [place] [address] [phone]  [facility] at [facility] [address], [place], [place] [address] [phone]

## 2024-02-29 NOTE — Telephone Encounter (Signed)
 Home phone number disconnected Cell phone cannot be completed  Hypothyroid - was reviewing labs later today while completing discharge documentation and realized I neglected to add this med / discuss this diagnosis, Sent Rx to preferred pharmacy w/ note for them to please try to reach patient / I will try again tomorrow.

## 2024-02-29 NOTE — Discharge Summary (Signed)
 Physician Discharge Summary   Patient: Sonya Wallace MRN: [id number]  DOB: 11-21-1960   Admit:     Date of Admission: 02/27/2024 Admitted from: home   Discharge: Date of discharge: 02/29/24 Disposition: Home Condition at discharge: good  CODE STATUS: FULL CODE     Discharge Physician: Laneta Blunt, DO [facility]     PCP: Patient, No Pcp Per  Recommendations for Outpatient Follow-up:  Follow up with PCP asap Oncology f/u in place  Discharge Instructions     Diet general   Complete by: As directed    Increase activity slowly   Complete by: As directed          Discharge Diagnoses: Principal Problem:   Symptomatic anemia Active Problems:   Tobacco abuse   B12 deficiency        Hospital course / significant events:  See assessment & plan     Consultants:  Hematology / oncology  Interventional radiology  Procedures/Surgeries: Bone marrow biopsy      ASSESSMENT & PLAN:    # Severe macrocytic anemia and leukopenia Status post PRBC transfusion and hemoglobin increased appropriately. Patient has mild increase of total bilirubin, with mild increase of indirect bilirubin, indicating this could be secondary to B12 deficiency.  LDH is significantly elevated.  Haptoglobin is pending. This is possible a case of severe macrocytic anemia with leukopenia secondary to B12 deficiency.  However the degree of anemia out of proportion to the severity of hemolysis.  Underwent a bone marrow biopsy for further evaluation to rule out underlying bone marrow disorders. Patient will follow-up with oncology outpatient to go over the bone marrow biopsy results.   # B12 deficiency, recommend vitamin B12 1000 mcg injections daily while inpatient and she will finish additional injections outpatient. check intrinsic antibody and parietal antibody workup outpatient. Per oncology If pernicious anemia is confirmed, recommend outpatient GI workup.   # Lung  nodules, tobacco use.  She will need to establish care with lung cancer screening program outpatient.  # Recurrent nausea/vomiting # Gastric abnormality Patient reports history of abdominal discomfort, nausea, and sometimes vomiting when she doesn't eat. CT shows sings gastritis, possible peptic ulcer disease -  treat with PPI   # Elevated TSH, low T4 Will send in synthroid    # HCM Follow outpatient   # Tobacco abuse Declines patch    No concerns based on BMI: Body mass index is 26.53 kg/m.SABRA Significantly low or high BMI is associated with higher medical risk.  Underweight - under 18  overweight - 25 to 29 obese - 30 or more Class 1 obesity: BMI of 30.0 to 34 Class 2 obesity: BMI of 35.0 to 39 Class 3 obesity: BMI of 40.0 to 49 Super Morbid Obesity: BMI 50-59 Super-super Morbid Obesity: BMI 60+ Healthy nutrition and physical activity advised as adjunct to other disease management and risk reduction treatments            Discharge Instructions  Allergies as of 02/29/2024   No Known Allergies      Medication List     STOP taking these medications    albuterol  108 (90 Base) MCG/ACT inhaler Commonly known as: VENTOLIN  HFA   Blood Pressure Cuff Misc   erythromycin  ophthalmic ointment   meclizine 25 MG tablet Commonly known as: ANTIVERT   predniSONE  10 MG (21) Tbpk tablet Commonly known as: STERAPRED UNI-PAK 21 TAB   tobramycin  0.3 % ophthalmic solution Commonly known as: TOBREX   TAKE these medications    pantoprazole  40 MG tablet Commonly known as: PROTONIX  Take 1 tablet (40 mg total) by mouth 2 (two) times daily for 30 days, THEN 1 tablet (40 mg total) daily. Start taking on: February 29, 2024          No Known Allergies   Subjective: pt feeling well following biopsy, no comcerns at this time, feels overall improved still tired, eager for dc home if possible   Discharge Exam: BP 122/72 (BP Location: Left Arm)   Pulse 84    Temp 97.7 F (36.5 C) (Axillary)   Resp 16   Ht 5' 2 (1.575 m)   Wt 65.8 kg   SpO2 99%   BMI 26.53 kg/m  General: Pt is alert, awake, not in acute distress Cardiovascular: RRR, S1/S2 +, no rubs, no gallops Respiratory: CTA bilaterally, no wheezing, no rhonchi Abdominal: Soft, NT, ND, bowel sounds + Extremities: no edema, no cyanosis     The results of significant diagnostics from this hospitalization (including imaging, microbiology, ancillary and laboratory) are listed below for reference.     Microbiology: No results found for this or any previous visit (from the past 240 hours).   Labs: BNP (last 3 results) No results for input(s): BNP in the last 8760 hours. Basic Metabolic Panel: Recent Labs  Lab 02/27/24 1407 02/28/24 0434  NA 136 136  K 5.0 4.2  CL 102 102  CO2 23 26  GLUCOSE 131* 92  BUN 9 9  CREATININE 0.69 0.74  CALCIUM 9.0 8.9   Liver Function Tests: Recent Labs  Lab 02/27/24 1407 02/27/24 1550 02/28/24 0434  AST 38  --  33  ALT 15  --  14  ALKPHOS 42  --  42  BILITOT 1.7* 1.7* 1.3*  PROT 6.7  --  6.3*  ALBUMIN 4.4  --  4.2   Recent Labs  Lab 02/27/24 1407  LIPASE 30   No results for input(s): AMMONIA in the last 168 hours. CBC: Recent Labs  Lab 02/27/24 1407 02/27/24 1558 02/28/24 0036 02/28/24 0434 02/29/24 0518 02/29/24 1118  WBC 2.0*  --  2.8* 2.2* 1.9* 1.9*  NEUTROABS  --   --  1.5*  --  1.2* 1.2*  HGB 4.6* 4.4* 9.5* 8.7* 8.7* 8.8*  HCT 13.3* 12.4* 27.4* 25.4* 24.7* 25.1*  MCV 126.7*  --  100.0 100.8* 98.4 98.4  PLT 168  --  142* 124* 127* 122*   Cardiac Enzymes: No results for input(s): CKTOTAL, CKMB, CKMBINDEX, TROPONINI in the last 168 hours. BNP: Invalid input(s): POCBNP CBG: Recent Labs  Lab 02/27/24 1410  GLUCAP 115*   D-Dimer No results for input(s): DDIMER in the last 72 hours. Hgb A1c No results for input(s): HGBA1C in the last 72 hours. Lipid Profile Recent Labs    02/29/24 0518   CHOL 125  HDL 30*  LDLCALC 63  TRIG 835*  CHOLHDL 4.2   Thyroid function studies Recent Labs    02/27/24 1550  TSH 22.500*   Anemia work up Recent Labs    02/27/24 1550 02/28/24 0434  VITAMINB12  --  <150*  FOLATE  --  11.1  RETICCTPCT 2.1  --    Urinalysis    Component Value Date/Time   COLORURINE YELLOW (A) 02/28/2024 0036   APPEARANCEUR CLEAR (A) 02/28/2024 0036   LABSPEC >1.046 (H) 02/28/2024 0036   PHURINE 6.0 02/28/2024 0036   GLUCOSEU NEGATIVE 02/28/2024 0036   HGBUR NEGATIVE 02/28/2024 0036   BILIRUBINUR NEGATIVE  02/28/2024 0036   KETONESUR NEGATIVE 02/28/2024 0036   PROTEINUR NEGATIVE 02/28/2024 0036   NITRITE NEGATIVE 02/28/2024 0036   LEUKOCYTESUR NEGATIVE 02/28/2024 0036   Sepsis Labs Recent Labs  Lab 02/28/24 0036 02/28/24 0434 02/29/24 0518 02/29/24 1118  WBC 2.8* 2.2* 1.9* 1.9*   Microbiology No results found for this or any previous visit (from the past 240 hours). Imaging CT CHEST ABDOMEN PELVIS W CONTRAST Result Date: 02/27/2024 EXAM: CT CHEST, ABDOMEN AND PELVIS WITH CONTRAST 02/27/2024 08:40:53 PM TECHNIQUE: CT of the chest, abdomen and pelvis was performed with the administration of 100 mL of iohexol  (OMNIPAQUE ) 300 MG/ML solution. Multiplanar reformatted images are provided for review. Automated exposure control, iterative reconstruction, and/or weight based adjustment of the mA/kV was utilized to reduce the radiation dose to as low as reasonably achievable. COMPARISON: None available. CLINICAL HISTORY: Abdominal pain, vomiting, anemia, several months of worsening fatigue and lightheadedness. FINDINGS: CHEST: MEDIASTINUM AND LYMPH NODES: Heart and pericardium are unremarkable. No visible coronary artery calcification and no pericardial effusion. The central airways are clear. The thoracic aorta is slightly tortuous with minimal calcific plaques of the descending segment and proximal right subclavian artery. The great vessels and the aorta are  otherwise free of visible plaques. There is no aortic or great vessel aneurysm, stenosis or dissection. No mediastinal, hilar or axillary lymphadenopathy. LUNGS AND PLEURA: There is bilateral apical linear and reticular scarring change. Mild diffuse bronchial thickening. Mild to moderate emphysematous disease is seen with centrilobular changes predominating. There is a 3 mm right upper lobe apical nodule on series 4 axial 32, 3 mm posterior right apical nodule on image 33, 3 mm nodule in the anterior right upper lobe base on image 68, 4 mm right middle lobe nodule on image 75 and a 6 mm subpleural right lower lobe lateral basal nodule on image 94. No nodules in the left lung. There is no consolidation or effusion bilaterally. No pneumothorax. ABDOMEN AND PELVIS: LIVER: The liver is unremarkable. GALLBLADDER AND BILE DUCTS: Gallbladder is unremarkable. No biliary ductal dilatation. SPLEEN: No acute abnormality. PANCREAS: No acute abnormality. ADRENAL GLANDS: No acute abnormality. There is no adrenal mass. KIDNEYS, URETERS AND BLADDER: No stones in the kidneys or ureters. No hydronephrosis. No perinephric or periureteral stranding. No renal mass enhancement. Urinary bladder is unremarkable. GI AND BOWEL: There is irregular mucosal fold thickening and enhancement in the antropyloric portion of the stomach consistent with gastritis or peptic ulcer disease. Endoscopy may be indicated given the clinical history of anemia. The stomach is distended with fluid and food products. The small bowel is normal in caliber without inflammatory change. The appendix is normal. There is either mild wall thickening or changes of underdistension in the mid to distal sigmoid colon. Correlate clinically for underlying colitis. There is no bowel obstruction. REPRODUCTIVE ORGANS: There is left greater than right gonadal vein engorgement and pelvic venous congestion. The uterus and ovaries are not enlarged. PERITONEUM AND RETROPERITONEUM: No  ascites. No free air. VASCULATURE: Aorta is normal in caliber. There is mild aortic atherosclerosis. No abdominal aortic aneurysm is seen. ABDOMINAL AND PELVIS LYMPH NODES: No lymphadenopathy. BONES AND SOFT TISSUES: There is thoracic spondylosis with no acute osseous findings. Multilevel thoracic degenerative disc disease. There is advanced degenerative disc disease at L5-S1 with vacuum phenomenon. No acute or significant osseous findings are seen elsewhere. No focal soft tissue abnormality. IMPRESSION: 1. Irregular mucosal fold thickening and enhancement in the antropyloric stomach consistent with gastritis or peptic ulcer disease; endoscopy is recommended  given anemia and symptoms. 2. Findings in the mid to distal sigmoid colon may reflect mild wall thickening versus underdistension; clinical correlation recommended for underlying colitis. 3. Multiple small right lung nodules, largest 6 mm in the right lower lobe subpleural region; recommend non-contrast chest CT at 612 months, then consider 1824 months follow-up per Fleischner Society guidelines given unknown risk. 4. Mild to moderate emphysema with centrilobular predominance; consider evaluation for eligibility for a low-dose CT lung cancer screening program. 5. 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 6. Aortic atherosclerosis . Electronically signed by: Francis Quam MD 02/27/2024 09:12 PM EST RP Workstation: [id number]   DG Chest Portable 1 View Result Date: 02/27/2024 EXAM: 1 VIEW(S) XRAY OF THE CHEST 02/27/2024 04:33:00 PM COMPARISON: None available. CLINICAL HISTORY: smoker with weight loss, eval for mass FINDINGS: LUNGS AND PLEURA: Peribronchial thickening and perihilar interstitial changes in the lungs. Bronchitic changes in the lungs. No pleural effusion. No pneumothorax. HEART AND MEDIASTINUM: No acute abnormality of the cardiac and mediastinal silhouettes. BONES AND SOFT TISSUES: No acute osseous  abnormality. IMPRESSION: 1. Bronchitic changes in the lungs. No focal mass or consolidation. Electronically signed by: Elsie Gravely MD 02/27/2024 05:20 PM EST RP Workstation: [id number]      Time coordinating discharge: over 30 minutes  SIGNED:  Temiloluwa Recchia DO [facility]

## 2024-02-29 NOTE — TOC CM/SW Note (Signed)
 Transition of Care [facility]) - Inpatient Brief Assessment   Patient Details  Name: Sonya Wallace MRN: [id number] Date of Birth: January 14, 1961  Transition of Care [facility]) CM/SW Contact:    Daved JONETTA Hamilton, RN Phone Number: 02/29/2024, 12:22 PM   Clinical Narrative:   Transition of Care [facility]) Screening Note   Patient Details  Name: Sonya Wallace Date of Birth: Jun 27, 1960   Transition of Care [facility]) CM/SW Contact:    Daved JONETTA Hamilton, RN Phone Number: 02/29/2024, 12:22 PM   Transportation, Utility, Social, [facility], and PCP resources added to AVS  Transition of Care Department [facility]) has reviewed patient and no TOC needs have been identified at this time. If new patient transition needs arise, please place a TOC consult.    Transition of Care Asessment: Insurance and Status: Selfpay ([facility] resource added to AVS) Patient has primary care physician: No ([facility] and PCP resources added to AVS)   Prior level of function:: Independent Prior/Current Home Services: No current home services Social Drivers of Health Review: SDOH reviewed interventions complete Readmission risk has been reviewed: Yes Transition of care needs: no transition of care needs at this time

## 2024-02-29 NOTE — Procedures (Signed)
Interventional Radiology Procedure Note  Procedure: CT guided aspirate and core biopsy of LEFT iliac bone Complications: None Recommendations: - Bedrest supine x 1 hrs - Hydrocodone PRN  Pain - Follow biopsy results  Signed,  Criselda Peaches, MD

## 2024-03-01 LAB — HAPTOGLOBIN: Haptoglobin: <10 mg/dL — ABNORMAL LOW (ref 37–355)

## 2024-03-01 LAB — HCV INTERPRETATION

## 2024-03-01 LAB — HCV AB W REFLEX TO QUANT PCR: HCV Ab: NONREACTIVE

## 2024-03-01 LAB — INTRINSIC FACTOR ANTIBODIES: Intrinsic Factor: 153.3 [AU]/ml — ABNORMAL HIGH (ref 0.0–1.1)

## 2024-03-02 LAB — MULTIPLE MYELOMA PANEL, SERUM
Albumin SerPl Elph-Mcnc: 3.8 g/dL (ref 2.9–4.4)
Albumin/Glob SerPl: 1.6 (ref 0.7–1.7)
Alpha 1: 0.2 g/dL (ref 0.0–0.4)
Alpha2 Glob SerPl Elph-Mcnc: 0.5 g/dL (ref 0.4–1.0)
B-Globulin SerPl Elph-Mcnc: 1 g/dL (ref 0.7–1.3)
Gamma Glob SerPl Elph-Mcnc: 0.8 g/dL (ref 0.4–1.8)
Globulin, Total: 2.5 g/dL (ref 2.2–3.9)
IgA: 500 mg/dL — ABNORMAL HIGH (ref 87–352)
IgG (Immunoglobin G), Serum: 820 mg/dL (ref 586–1602)
IgM (Immunoglobulin M), Srm: 53 mg/dL (ref 26–217)
M Protein SerPl Elph-Mcnc: 0.6 g/dL — ABNORMAL HIGH
Total Protein ELP: 6.3 g/dL (ref 6.0–8.5)

## 2024-03-02 LAB — COMP PANEL: LEUKEMIA/LYMPHOMA

## 2024-03-04 LAB — ANTI-PARIETAL ANTIBODY: Parietal Cell Antibody-IgG: 60.2 U — ABNORMAL HIGH (ref 0.0–20.0)

## 2024-03-05 ENCOUNTER — Other Ambulatory Visit: Payer: Self-pay | Admitting: Oncology

## 2024-03-05 ENCOUNTER — Inpatient Hospital Stay: Payer: Self-pay | Attending: Oncology

## 2024-03-05 DIAGNOSIS — E538 Deficiency of other specified B group vitamins: Secondary | ICD-10-CM

## 2024-03-05 DIAGNOSIS — C9000 Multiple myeloma not having achieved remission: Secondary | ICD-10-CM | POA: Insufficient documentation

## 2024-03-05 DIAGNOSIS — Z79890 Hormone replacement therapy (postmenopausal): Secondary | ICD-10-CM | POA: Insufficient documentation

## 2024-03-05 DIAGNOSIS — Z79899 Other long term (current) drug therapy: Secondary | ICD-10-CM | POA: Insufficient documentation

## 2024-03-05 DIAGNOSIS — D510 Vitamin B12 deficiency anemia due to intrinsic factor deficiency: Secondary | ICD-10-CM | POA: Insufficient documentation

## 2024-03-05 LAB — SURGICAL PATHOLOGY

## 2024-03-05 MED ORDER — CYANOCOBALAMIN 1000 MCG/ML IJ SOLN
1000.0000 ug | Freq: Once | INTRAMUSCULAR | Status: AC
  Administered 2024-03-05: 1000 ug via INTRAMUSCULAR
  Filled 2024-03-05: qty 1

## 2024-03-06 ENCOUNTER — Other Ambulatory Visit: Payer: Self-pay

## 2024-03-06 ENCOUNTER — Inpatient Hospital Stay: Payer: Self-pay

## 2024-03-06 DIAGNOSIS — E538 Deficiency of other specified B group vitamins: Secondary | ICD-10-CM

## 2024-03-06 LAB — SURGICAL PATHOLOGY

## 2024-03-06 MED ORDER — CYANOCOBALAMIN 1000 MCG/ML IJ SOLN
1000.0000 ug | Freq: Once | INTRAMUSCULAR | Status: AC
  Administered 2024-03-06: 1000 ug via INTRAMUSCULAR
  Filled 2024-03-06: qty 1

## 2024-03-07 ENCOUNTER — Inpatient Hospital Stay: Payer: Self-pay | Admitting: Oncology

## 2024-03-07 ENCOUNTER — Inpatient Hospital Stay: Payer: Self-pay

## 2024-03-07 ENCOUNTER — Encounter: Payer: Self-pay | Admitting: Oncology

## 2024-03-07 VITALS — BP 140/84 | HR 86 | Temp 97.6°F | Resp 18 | Wt 103.4 lb

## 2024-03-07 DIAGNOSIS — E538 Deficiency of other specified B group vitamins: Secondary | ICD-10-CM

## 2024-03-07 DIAGNOSIS — Z789 Other specified health status: Secondary | ICD-10-CM | POA: Insufficient documentation

## 2024-03-07 DIAGNOSIS — D472 Monoclonal gammopathy: Secondary | ICD-10-CM | POA: Insufficient documentation

## 2024-03-07 DIAGNOSIS — D510 Vitamin B12 deficiency anemia due to intrinsic factor deficiency: Secondary | ICD-10-CM | POA: Insufficient documentation

## 2024-03-07 LAB — CBC WITH DIFFERENTIAL (CANCER CENTER ONLY)
Abs Immature Granulocytes: 0.02 K/uL (ref 0.00–0.07)
Basophils Absolute: 0 K/uL (ref 0.0–0.1)
Basophils Relative: 0 %
Eosinophils Absolute: 0.1 K/uL (ref 0.0–0.5)
Eosinophils Relative: 2 %
HCT: 30 % — ABNORMAL LOW (ref 36.0–46.0)
Hemoglobin: 9.9 g/dL — ABNORMAL LOW (ref 12.0–15.0)
Immature Granulocytes: 0 %
Lymphocytes Relative: 14 %
Lymphs Abs: 0.7 K/uL (ref 0.7–4.0)
MCH: 33.2 pg (ref 26.0–34.0)
MCHC: 33 g/dL (ref 30.0–36.0)
MCV: 100.7 fL — ABNORMAL HIGH (ref 80.0–100.0)
Monocytes Absolute: 1.1 K/uL — ABNORMAL HIGH (ref 0.1–1.0)
Monocytes Relative: 20 %
Neutro Abs: 3.3 K/uL (ref 1.7–7.7)
Neutrophils Relative %: 64 %
Platelet Count: 310 K/uL (ref 150–400)
RBC: 2.98 MIL/uL — ABNORMAL LOW (ref 3.87–5.11)
RDW: 20.9 % — ABNORMAL HIGH (ref 11.5–15.5)
WBC Count: 5.2 K/uL (ref 4.0–10.5)
nRBC: 0 % (ref 0.0–0.2)

## 2024-03-07 LAB — SAMPLE TO BLOOD BANK

## 2024-03-07 MED ORDER — CYANOCOBALAMIN 1000 MCG/ML IJ SOLN
1000.0000 ug | Freq: Once | INTRAMUSCULAR | Status: AC
  Administered 2024-03-07: 1000 ug via INTRAMUSCULAR
  Filled 2024-03-07: qty 1

## 2024-03-07 NOTE — Progress Notes (Signed)
 Hematology/Oncology Progress note [phone] [phone]   REASON OF VISIT  Pernicious anemia, smoldering myeloma.   ASSESSMENT & PLAN:  B12 deficiency Continue B12 1000mcg daily x 4 followed by weekly x 4, followed by monthly.   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  Recommend GI evaluation. 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 Lab Results  Component Value Date   MPROTEIN 0.6 (H) 02/28/2024   KPAFRELGTCHN 84.5 (H) 02/28/2024   LAMBDASER 9.1 02/28/2024   KAPLAMBRATIO 9.29 (H) 02/28/2024     Bone marrow biopsy showed 13% plasma cells.  No renal impairment, anemia can be due to B12 deficiency. No hypercalcemia Obtain skeletal survey.  Hold off PET scan as patient does not have insurance currently observation   Need for follow-up by social worker Refer to child psychotherapist.   Orders Placed This Encounter  Procedures   DG Bone Survey Met    Standing Status:   Future    Expected Date:   03/08/2024    Expiration Date:   03/07/2025    Reason for Exam (SYMPTOM  OR DIAGNOSIS REQUIRED):   smoldering multiple myeloma    Preferred imaging location?:   [facility]   Beta 2 microglobulin, serum    Standing Status:   Future    Expected Date:   06/05/2024    Expiration Date:   03/07/2025   CBC with Differential (Cancer Center Only)    Standing Status:   Future    Expected Date:   06/05/2024    Expiration Date:   09/03/2024   CMP (Cancer Center only)    Standing Status:   Future    Expected Date:   06/05/2024    Expiration Date:   09/03/2024   Vitamin B12    Standing Status:   Future    Expected Date:   06/05/2024    Expiration Date:   09/03/2024   Multiple Myeloma Panel (SPEP&IFE w/QIG)    Standing Status:   Future    Expected Date:   06/05/2024    Expiration Date:   09/03/2024   Kappa/lambda light chains    Standing Status:   Future    Expected Date:   06/05/2024    Expiration Date:   09/03/2024    Ambulatory referral to Social Work    Referral Priority:   Routine    Referral Type:   Consultation    Referral Reason:   Specialty Services Required    Number of Visits Requested:   1   I called her daughter and not able to reach.  Follow up in 3 months.  All questions were answered. The patient knows to call the clinic with any problems, questions or concerns. No barriers to learning was detected.  Zelphia Cap, MD, PhD [facility] Hematology Oncology 03/07/2024    INTERVAL HISTORY: Sonya Wallace 63 y.o. female presents for follow up of pernicious anemia, B12 deficiency, smoldering myeloma.  02/29/2024 bone marrow biopsy showed  BONE MARROW, ASPIRATE, CLOT, CORE:  -Hypercellular bone marrow (80%) involved by plasma cell neoplasm (13 %  plasma cells by manual aspirate differential, approximately 10% by CD138  immunohistochemical analysis, and kappa monotypic by kappa/lambda in  situ hybridization).  See comment.   PERIPHERAL BLOOD:  - Pancytopenia   She reports feeling well. Getting B12 injections.  Fatigue has improved.   ALLERGIES:  has no known allergies.  MEDICATIONS:  Current Outpatient Medications  Medication Sig Dispense Refill   levothyroxine  (SYNTHROID ) 100  MCG tablet Take 1 tablet (100 mcg total) by mouth daily. NOTE - attempted to call patient but unable to reach her, I realized I had not discussed her hypothyroid (new dx) with her at time of discharge so sent Rx and she will need outpatient follow up on this to recheck thyroid levels in 4-6 weeks please let patient know! Thanks! -Dr Marsa hospitalist [facility] (Patient not taking: Reported on 03/07/2024) 30 tablet 0   pantoprazole  (PROTONIX ) 40 MG tablet Take 1 tablet (40 mg total) by mouth 2 (two) times daily for 30 days, THEN 1 tablet (40 mg total) daily. (Patient not taking: No sig reported) 90 tablet 0   No current facility-administered medications for this visit.     Review of Systems - Oncology   PHYSICAL  EXAMINATION: ECOG PERFORMANCE STATUS: 1 - Symptomatic but completely ambulatory  Vitals:   03/07/24 1439  BP: (!) 140/84  Pulse: 86  Resp: 18  Temp: 97.6 F (36.4 C)  SpO2: 100%   Filed Weights   03/07/24 1439  Weight: 103 lb 6.4 oz (46.9 kg)    Physical Exam Constitutional:      General: She is not in acute distress.    Appearance: She is not diaphoretic.  HENT:     Head: Normocephalic and atraumatic.  Eyes:     General: No scleral icterus. Cardiovascular:     Rate and Rhythm: Normal rate and regular rhythm.  Pulmonary:     Effort: Pulmonary effort is normal. No respiratory distress.     Breath sounds: No wheezing.  Abdominal:     General: There is no distension.     Palpations: Abdomen is soft.     Tenderness: There is no abdominal tenderness.  Musculoskeletal:        General: Normal range of motion.     Cervical back: Normal range of motion and neck supple.  Skin:    General: Skin is warm and dry.     Findings: No erythema.  Neurological:     Mental Status: She is alert and oriented to person, place, and time. Mental status is at baseline.     Motor: No abnormal muscle tone.  Psychiatric:        Mood and Affect: Mood and affect normal.      LABORATORY DATA:  I have reviewed the data as listed    Latest Ref Rng & Units 03/07/2024    2:02 PM 02/29/2024   11:18 AM 02/29/2024    5:18 AM  CBC  WBC 4.0 - 10.5 K/uL 5.2  1.9  1.9   Hemoglobin 12.0 - 15.0 g/dL 9.9  8.8  8.7   Hematocrit 36.0 - 46.0 % 30.0  25.1  24.7   Platelets 150 - 400 K/uL 310  122  127       Latest Ref Rng & Units 02/28/2024    4:34 AM 02/27/2024    3:50 PM 02/27/2024    2:07 PM  CMP  Glucose 70 - 99 mg/dL 92   868   BUN 8 - 23 mg/dL 9   9   Creatinine 9.55 - 1.00 mg/dL 9.25   9.30   Sodium 864 - 145 mmol/L 136   136   Potassium 3.5 - 5.1 mmol/L 4.2   5.0   Chloride 98 - 111 mmol/L 102   102   CO2 22 - 32 mmol/L 26   23   Calcium 8.9 - 10.3 mg/dL 8.9   9.0   Total Protein  6.5  - 8.1 g/dL 6.3   6.7   Total Bilirubin 0.0 - 1.2 mg/dL 1.3  1.7  1.7   Alkaline Phos 38 - 126 U/L 42   42   AST 15 - 41 U/L 33   38   ALT 0 - 44 U/L 14   15      RADIOGRAPHIC STUDIES: I have personally reviewed the radiological images as listed and agreed with the findings in the report. No results found.

## 2024-03-07 NOTE — Assessment & Plan Note (Signed)
Refer to Child psychotherapist.

## 2024-03-07 NOTE — Assessment & Plan Note (Signed)
 Parietal cell antibody IgG positive.  Recommend GI evaluation. She declined

## 2024-03-07 NOTE — Assessment & Plan Note (Signed)
 Continue B12 1000mcg daily x 4 followed by weekly x 4, followed by monthly.

## 2024-03-07 NOTE — Assessment & Plan Note (Addendum)
 Labs are reviewed and discussed with patient. Lab Results  Component Value Date   MPROTEIN 0.6 (H) 02/28/2024   KPAFRELGTCHN 84.5 (H) 02/28/2024   LAMBDASER 9.1 02/28/2024   KAPLAMBRATIO 9.29 (H) 02/28/2024     Bone marrow biopsy showed 13% plasma cells.  No renal impairment, anemia can be due to B12 deficiency. No hypercalcemia Obtain skeletal survey.  Hold off PET scan as patient does not have insurance currently observation

## 2024-03-07 NOTE — Patient Instructions (Signed)
Skeletal survey/ bone survey- this scan is a walk-in and no appointment is needed. You may go to the medical mall at your own convenience to have this scan done.

## 2024-03-08 ENCOUNTER — Inpatient Hospital Stay: Payer: Self-pay

## 2024-03-08 ENCOUNTER — Encounter (HOSPITAL_COMMUNITY): Payer: Self-pay

## 2024-03-14 ENCOUNTER — Telehealth: Payer: Self-pay

## 2024-03-14 ENCOUNTER — Inpatient Hospital Stay: Payer: Self-pay

## 2024-03-14 DIAGNOSIS — E538 Deficiency of other specified B group vitamins: Secondary | ICD-10-CM

## 2024-03-14 MED ORDER — CYANOCOBALAMIN 1000 MCG/ML IJ SOLN
1000.0000 ug | Freq: Once | INTRAMUSCULAR | Status: AC
  Administered 2024-03-14: 1000 ug via INTRAMUSCULAR
  Filled 2024-03-14: qty 1

## 2024-03-14 NOTE — Telephone Encounter (Signed)
 Clinical Social Worker received referral from medical provider to contact patient's daughter, Amy.  Attempted her phone number and both of patient's numbers.  They were either not in service or unavailable.  CSW to try again in the future.

## 2024-03-19 ENCOUNTER — Telehealth: Payer: Self-pay | Admitting: Oncology

## 2024-03-19 NOTE — Telephone Encounter (Signed)
 Pt called to r/s B12 injection for Wednesday - said she needs it on Thursday for this week - r/s appt w/pt and pt confirmed date/time - LH

## 2024-03-21 ENCOUNTER — Inpatient Hospital Stay: Payer: Self-pay

## 2024-03-22 ENCOUNTER — Inpatient Hospital Stay: Payer: Self-pay

## 2024-03-22 DIAGNOSIS — E538 Deficiency of other specified B group vitamins: Secondary | ICD-10-CM

## 2024-03-22 MED ORDER — CYANOCOBALAMIN 1000 MCG/ML IJ SOLN
1000.0000 ug | Freq: Once | INTRAMUSCULAR | Status: DC
  Filled 2024-03-22: qty 1

## 2024-03-22 MED ORDER — CYANOCOBALAMIN 1000 MCG/ML IJ SOLN
1000.0000 ug | Freq: Once | INTRAMUSCULAR | Status: AC
  Administered 2024-03-22: 11:00:00 1000 ug via INTRAMUSCULAR

## 2024-03-28 ENCOUNTER — Inpatient Hospital Stay: Payer: Self-pay

## 2024-03-28 DIAGNOSIS — E538 Deficiency of other specified B group vitamins: Secondary | ICD-10-CM

## 2024-03-28 MED ORDER — CYANOCOBALAMIN 1000 MCG/ML IJ SOLN
1000.0000 ug | Freq: Once | INTRAMUSCULAR | Status: AC
  Administered 2024-03-28: 1000 ug via INTRAMUSCULAR
  Filled 2024-03-28: qty 1

## 2024-04-04 ENCOUNTER — Inpatient Hospital Stay: Payer: Self-pay

## 2024-04-04 DIAGNOSIS — E538 Deficiency of other specified B group vitamins: Secondary | ICD-10-CM

## 2024-04-04 MED ORDER — CYANOCOBALAMIN 1000 MCG/ML IJ SOLN
1000.0000 ug | Freq: Once | INTRAMUSCULAR | Status: AC
  Administered 2024-04-04: 1000 ug via INTRAMUSCULAR
  Filled 2024-04-04: qty 1

## 2024-04-16 ENCOUNTER — Encounter: Payer: Self-pay | Admitting: Oncology

## 2024-04-27 ENCOUNTER — Encounter: Payer: Self-pay | Admitting: Oncology

## 2024-05-04 ENCOUNTER — Inpatient Hospital Stay: Payer: Self-pay | Attending: Oncology

## 2024-05-08 ENCOUNTER — Inpatient Hospital Stay: Attending: Oncology

## 2024-05-08 DIAGNOSIS — E538 Deficiency of other specified B group vitamins: Secondary | ICD-10-CM

## 2024-05-08 MED ORDER — CYANOCOBALAMIN 1000 MCG/ML IJ SOLN
1000.0000 ug | Freq: Once | INTRAMUSCULAR | Status: AC
  Administered 2024-05-08: 1000 ug via INTRAMUSCULAR
  Filled 2024-05-08: qty 1

## 2024-06-01 ENCOUNTER — Inpatient Hospital Stay: Payer: Self-pay

## 2024-06-28 ENCOUNTER — Inpatient Hospital Stay: Payer: Self-pay

## 2024-07-03 ENCOUNTER — Inpatient Hospital Stay: Payer: Self-pay | Admitting: Oncology

## 2024-07-03 ENCOUNTER — Inpatient Hospital Stay: Payer: Self-pay
